# Patient Record
Sex: Female | Born: 1970
Health system: Southern US, Community
[De-identification: ages and names within clinical notes are randomized; demographics above are authoritative.]

## PROBLEM LIST (undated history)

## (undated) ENCOUNTER — Emergency Department (HOSPITAL_COMMUNITY): Admission: EM | Payer: Self-pay

## (undated) ENCOUNTER — Emergency Department (HOSPITAL_COMMUNITY): Payer: No Typology Code available for payment source | Source: Home / Self Care

## (undated) DIAGNOSIS — E785 Hyperlipidemia, unspecified: Secondary | ICD-10-CM

## (undated) DIAGNOSIS — E119 Type 2 diabetes mellitus without complications: Secondary | ICD-10-CM

## (undated) DIAGNOSIS — J45909 Unspecified asthma, uncomplicated: Secondary | ICD-10-CM

## (undated) DIAGNOSIS — E1165 Type 2 diabetes mellitus with hyperglycemia: Secondary | ICD-10-CM

## (undated) DIAGNOSIS — I10 Essential (primary) hypertension: Secondary | ICD-10-CM

## (undated) HISTORY — DX: Hyperlipidemia, unspecified: E78.5

## (undated) HISTORY — DX: Type 2 diabetes mellitus without complications: E11.9

## (undated) HISTORY — DX: Unspecified asthma, uncomplicated: J45.909

---
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
## 2001-10-08 DIAGNOSIS — IMO0002 Reserved for concepts with insufficient information to code with codable children: Secondary | ICD-10-CM

## 2001-10-08 DIAGNOSIS — I10 Essential (primary) hypertension: Secondary | ICD-10-CM

## 2001-10-08 HISTORY — DX: Essential (primary) hypertension: I10

## 2001-10-08 HISTORY — DX: Reserved for concepts with insufficient information to code with codable children: IMO0002

## 2004-10-08 HISTORY — PX: ABDOMINAL HYSTERECTOMY: SHX81

## 2013-10-09 ENCOUNTER — Encounter (HOSPITAL_COMMUNITY): Payer: Self-pay | Admitting: Emergency Medicine

## 2013-10-09 ENCOUNTER — Emergency Department (HOSPITAL_COMMUNITY)
Admission: EM | Admit: 2013-10-09 | Discharge: 2013-10-09 | Disposition: A | Discharge status: Home / Self Care | Payer: Self-pay | Attending: Emergency Medicine | Admitting: Emergency Medicine

## 2013-10-09 ENCOUNTER — Emergency Department (HOSPITAL_COMMUNITY): Payer: Self-pay

## 2013-10-09 DIAGNOSIS — R51 Headache: Secondary | ICD-10-CM

## 2013-10-09 DIAGNOSIS — Z79899 Other long term (current) drug therapy: Secondary | ICD-10-CM | POA: Insufficient documentation

## 2013-10-09 DIAGNOSIS — S4980XA Other specified injuries of shoulder and upper arm, unspecified arm, initial encounter: Secondary | ICD-10-CM | POA: Insufficient documentation

## 2013-10-09 DIAGNOSIS — R519 Headache, unspecified: Secondary | ICD-10-CM

## 2013-10-09 DIAGNOSIS — S0990XA Unspecified injury of head, initial encounter: Secondary | ICD-10-CM | POA: Insufficient documentation

## 2013-10-09 DIAGNOSIS — F17200 Nicotine dependence, unspecified, uncomplicated: Secondary | ICD-10-CM | POA: Insufficient documentation

## 2013-10-09 DIAGNOSIS — Y9389 Activity, other specified: Secondary | ICD-10-CM | POA: Insufficient documentation

## 2013-10-09 DIAGNOSIS — S46909A Unspecified injury of unspecified muscle, fascia and tendon at shoulder and upper arm level, unspecified arm, initial encounter: Secondary | ICD-10-CM | POA: Insufficient documentation

## 2013-10-09 DIAGNOSIS — I10 Essential (primary) hypertension: Secondary | ICD-10-CM | POA: Insufficient documentation

## 2013-10-09 DIAGNOSIS — Y92410 Unspecified street and highway as the place of occurrence of the external cause: Secondary | ICD-10-CM | POA: Insufficient documentation

## 2013-10-09 HISTORY — DX: Essential (primary) hypertension: I10

## 2013-10-09 MED ORDER — CYCLOBENZAPRINE HCL 10 MG PO TABS: 10.0000 mg | ORAL_TABLET | Freq: Two times a day (BID) | ORAL | Status: DC | PRN

## 2013-10-09 MED ORDER — METHOCARBAMOL 500 MG PO TABS
500.0000 mg | ORAL_TABLET | Freq: Once | ORAL | Status: AC
  Administered 2013-10-09: 500 mg via ORAL
  Filled 2013-10-09: qty 1

## 2013-10-09 MED ORDER — NAPROXEN 500 MG PO TABS: 500.0000 mg | ORAL_TABLET | Freq: Two times a day (BID) | ORAL | Status: DC

## 2013-10-09 MED ORDER — NAPROXEN 500 MG PO TABS
500.0000 mg | ORAL_TABLET | Freq: Once | ORAL | Status: AC
  Administered 2013-10-09: 500 mg via ORAL
  Filled 2013-10-09: qty 1

## 2013-10-09 MED ORDER — CYCLOBENZAPRINE HCL 10 MG PO TABS: 10.0000 mg | ORAL_TABLET | Freq: Once | ORAL | Status: DC

## 2013-10-09 NOTE — Discharge Instructions (Signed)
Take Naprosyn as directed for pain. Take Flexeril as needed for muscle pain/spasm. Refer to attached documents for more information. Return to the ED with worsening or concerning symptoms.
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
## 2013-10-21 NOTE — ED Provider Notes (Signed)
Medical screening examination/treatment/procedure(s) were performed by non-physician practitioner and as supervising physician I was immediately available for consultation/collaboration.  EKG Interpretation   None         Elizjah Noblet, MD 10/21/13 0003 

## 2014-10-18 ENCOUNTER — Encounter: Payer: Self-pay | Admitting: Family Medicine

## 2014-10-18 ENCOUNTER — Ambulatory Visit: Payer: Self-pay | Attending: Family Medicine | Admitting: Family Medicine

## 2014-10-18 VITALS — BP 170/100 | HR 107 | Temp 98.2°F | Resp 16 | Ht 67.0 in | Wt 200.0 lb

## 2014-10-18 DIAGNOSIS — E785 Hyperlipidemia, unspecified: Secondary | ICD-10-CM

## 2014-10-18 DIAGNOSIS — F1010 Alcohol abuse, uncomplicated: Secondary | ICD-10-CM

## 2014-10-18 DIAGNOSIS — M25475 Effusion, left foot: Secondary | ICD-10-CM | POA: Insufficient documentation

## 2014-10-18 DIAGNOSIS — I152 Hypertension secondary to endocrine disorders: Secondary | ICD-10-CM

## 2014-10-18 DIAGNOSIS — I10 Essential (primary) hypertension: Secondary | ICD-10-CM

## 2014-10-18 DIAGNOSIS — E1169 Type 2 diabetes mellitus with other specified complication: Secondary | ICD-10-CM

## 2014-10-18 DIAGNOSIS — E118 Type 2 diabetes mellitus with unspecified complications: Secondary | ICD-10-CM

## 2014-10-18 DIAGNOSIS — Z90710 Acquired absence of both cervix and uterus: Secondary | ICD-10-CM | POA: Insufficient documentation

## 2014-10-18 DIAGNOSIS — E119 Type 2 diabetes mellitus without complications: Secondary | ICD-10-CM | POA: Insufficient documentation

## 2014-10-18 DIAGNOSIS — M79672 Pain in left foot: Secondary | ICD-10-CM

## 2014-10-18 LAB — CBC
HCT: 38.9 % (ref 36.0–46.0)
HEMOGLOBIN: 12.5 g/dL (ref 12.0–15.0)
MCH: 28.4 pg (ref 26.0–34.0)
MCHC: 32.1 g/dL (ref 30.0–36.0)
MCV: 88.4 fL (ref 78.0–100.0)
MPV: 12 fL (ref 8.6–12.4)
Platelets: 231 10*3/uL (ref 150–400)
RBC: 4.4 MIL/uL (ref 3.87–5.11)
RDW: 13.2 % (ref 11.5–15.5)
WBC: 7.1 10*3/uL (ref 4.0–10.5)

## 2014-10-18 LAB — LIPID PANEL
CHOLESTEROL: 216 mg/dL — AB (ref 0–200)
HDL: 64 mg/dL (ref >39)
TRIGLYCERIDES: 783 mg/dL — AB (ref <150)
Total CHOL/HDL Ratio: 3.4 Ratio

## 2014-10-18 LAB — TSH: TSH: 1.276 u[IU]/mL (ref 0.350–4.500)

## 2014-10-18 LAB — COMPLETE METABOLIC PANEL WITH GFR
ALT: 21 U/L (ref 0–35)
AST: 22 U/L (ref 0–37)
Albumin: 3.7 g/dL (ref 3.5–5.2)
Alkaline Phosphatase: 42 U/L (ref 39–117)
BILIRUBIN TOTAL: 0.5 mg/dL (ref 0.2–1.2)
BUN: 14 mg/dL (ref 6–23)
CALCIUM: 9.3 mg/dL (ref 8.4–10.5)
CO2: 26 meq/L (ref 19–32)
CREATININE: 0.78 mg/dL (ref 0.50–1.10)
Chloride: 101 mEq/L (ref 96–112)
GFR, Est African American: >89 mL/min
GLUCOSE: 200 mg/dL — AB (ref 70–99)
Potassium: 4.5 mEq/L (ref 3.5–5.3)
Sodium: 136 mEq/L (ref 135–145)
Total Protein: 6.6 g/dL (ref 6.0–8.3)

## 2014-10-18 LAB — POCT GLYCOSYLATED HEMOGLOBIN (HGB A1C): HEMOGLOBIN A1C: 8.1

## 2014-10-18 LAB — URIC ACID: URIC ACID, SERUM: 5.5 mg/dL (ref 2.4–7.0)

## 2014-10-18 LAB — GLUCOSE, POCT (MANUAL RESULT ENTRY): POC Glucose: 177 mg/dl — AB (ref 70–99)

## 2014-10-18 MED ORDER — TRUERESULT BLOOD GLUCOSE W/DEVICE KIT: 1.0000 | PACK | Freq: Three times a day (TID) | Status: DC

## 2014-10-18 MED ORDER — AMLODIPINE BESYLATE 10 MG PO TABS: 5.0000 mg | ORAL_TABLET | Freq: Every day | ORAL | Status: DC

## 2014-10-18 MED ORDER — LISINOPRIL 40 MG PO TABS: 40.0000 mg | ORAL_TABLET | Freq: Every day | ORAL | Status: DC

## 2014-10-18 MED ORDER — GLIMEPIRIDE 4 MG PO TABS: 8.0000 mg | ORAL_TABLET | Freq: Every day | ORAL | Status: DC

## 2014-10-18 MED ORDER — CLONIDINE HCL 0.1 MG PO TABS
0.2000 mg | ORAL_TABLET | Freq: Once | ORAL | Status: AC
  Administered 2014-10-18: 0.2 mg via ORAL

## 2014-10-18 MED ORDER — METFORMIN HCL ER 500 MG PO TB24: 1000.0000 mg | ORAL_TABLET | Freq: Two times a day (BID) | ORAL | Status: DC

## 2014-10-18 MED ORDER — TRUEPLUS LANCETS 28G MISC: 1.0000 | Freq: Three times a day (TID) | Status: DC

## 2014-10-18 MED ORDER — GLUCOSE BLOOD VI STRP: 1.0000 | ORAL_STRIP | Freq: Three times a day (TID) | Status: DC

## 2014-10-18 NOTE — Progress Notes (Signed)
   Subjective:    Patient ID: Phyllis Kemp, female    DOB: 11/10/1970, 44 y.o.   MRN: [id number] CC: establish care, chronic HTN, DM2, HLD  HPI 44 yo F presents to establish care and discuss:  1. HTN: ran out of medicine x 2 days. Previously on lisinopril 20 mg daily. No cough. No CP or SOB. Sometimes headache.   2. DM2: does not have meter at home. Working on weight loss. Drinks water. No soda. Some juice. Out of metformin and amaryl for past 2-3 days. No polydipsia, polyuria,or tingling or numbness in extremities.   3. L foot pain: x 6 weeks. Started in 08/2014. Now resolved. Dorsal and plantar foot. Associated with swelling. No trauma. Patient self treated with ibuprofen 800 mg 2-3 times a day.   Soc Hx: non smoker  Med Hx: HTN dx 2003 Surg Hx: s/p hysterectomy in 2006 for uterine fibroids  Review of Systems As per HPI     Objective:   Physical Exam BP 183/122 mmHg  Pulse 107  Temp(Src) 98.2 F (36.8 C) (Oral)  Resp 16  Ht 5\' 7"  (1.702 m)  Wt 200 lb (90.719 kg)  BMI 31.32 kg/m2  SpO2 95% General appearance: alert, cooperative and no distress Throat: moist mucus membranes  Lungs: clear to auscultation bilaterally Heart: regular rate and rhythm, S1, S2 normal, no murmur, click, rub or gallop Extremities: extremities normal, atraumatic, no cyanosis or edema Skin: Skin color, texture, turgor normal. No rashes or lesions  Patient given clonidine 0.2 mg x one.    Diabetic foot exam done     Assessment & Plan:

## 2014-10-18 NOTE — Assessment & Plan Note (Signed)
A: DM2: fair control Lab Results  Component Value Date   HGBA1C 8.10 10/18/2014  P: Metformin XR Increase amaryl to 8 mg daily CBG monitoring at home

## 2014-10-18 NOTE — Assessment & Plan Note (Signed)
2. HTN: BP goal < 140/90  Lisinopril 40 mg daily Norvasc 5 mg daily with a plan to increase to 10 mg daily if BP not at goal on 5 mg

## 2014-10-18 NOTE — Progress Notes (Signed)
Pt is here to establish care. Pt has a history of HTN, diabetes, hyperlipidemia.

## 2014-10-18 NOTE — Assessment & Plan Note (Signed)
A: pain has resolved. History sounds like gout. P: Uric acid

## 2014-10-18 NOTE — Patient Instructions (Addendum)
Ms. Dannielle Burnownes,  Thank you for coming in today. It was a pleasure meeting you. I look forward to being your primary doctor.   1. For your diet:  1. Make sure to eat breakfast, lunch and dinner (also may add one snack mid morning or mid afternoon).  2. Carbs: no more than 2 servings (30 gram/2oz) per meal and 1 serving per snack.  3. Exercise such that you sweat some and your heart rate goes up most days of the week.  4. Water, water, water  5. Check blood sugar 2-3 x per day to get a feel for how different foods effect your blood sugar:  Goal fasting 70-130  Goal after eating < 160  6. Beware of hypoglycemia which is blood sugar < 70 with or without symptoms.   Will likely change statin so removed simvastatin for now, will review cholesterol results.   2. HTN: BP goal < 140/90  Lisinopril 40 mg daily Norvasc 5 mg daily with a plan to increase to 10 mg daily if BP not at goal on 5 mg   You will be called with lab results F/u in 3 weeks for RN BP check and blood sugar review F/u with me in 6 weeks for physical with pap smear.   Dr. Armen PickupFunches

## 2014-10-19 ENCOUNTER — Telehealth: Payer: Self-pay | Admitting: *Deleted

## 2014-10-19 DIAGNOSIS — E785 Hyperlipidemia, unspecified: Secondary | ICD-10-CM

## 2014-10-19 DIAGNOSIS — E1169 Type 2 diabetes mellitus with other specified complication: Secondary | ICD-10-CM | POA: Insufficient documentation

## 2014-10-19 LAB — MICROALBUMIN / CREATININE URINE RATIO
CREATININE, URINE: 240.5 mg/dL
Microalb Creat Ratio: 32.4 mg/g — ABNORMAL HIGH (ref 0.0–30.0)
Microalb, Ur: 7.8 mg/dL — ABNORMAL HIGH (ref <2.0)

## 2014-10-19 MED ORDER — ATORVASTATIN CALCIUM 40 MG PO TABS: 40.0000 mg | ORAL_TABLET | Freq: Every day | ORAL | Status: DC

## 2014-10-19 NOTE — Addendum Note (Signed)
Addended by: Dessa PhiFUNCHES, Genowefa Morga on: 10/19/2014 09:32 AM   Modules accepted: Orders

## 2014-10-19 NOTE — Telephone Encounter (Signed)
-----   Message from Lora PaulaJosalyn C Funches, MD sent at 10/19/2014  9:32 AM EST ----- Labs normal except for elevated blood sugar (200), elevated urine microalbumin, and elevated non-fasting total cholesterol and triglycerides. Continue current treatment plan with the addition of lipitor 40 mg daily.

## 2014-10-19 NOTE — Telephone Encounter (Signed)
Left VM to return my call

## 2014-10-22 ENCOUNTER — Telehealth: Payer: Self-pay | Admitting: Emergency Medicine

## 2014-10-22 NOTE — Telephone Encounter (Signed)
-----   Message from Josalyn C Funches, MD sent at 10/19/2014  9:32 AM EST ----- Labs normal except for elevated blood sugar (200), elevated urine microalbumin, and elevated non-fasting total cholesterol and triglycerides. Continue current treatment plan with the addition of lipitor 40 mg daily. 

## 2014-10-22 NOTE — Telephone Encounter (Signed)
Left message for pt to call for lab results Script Lipitor 40 mg tab @ [facility]

## 2014-10-27 ENCOUNTER — Telehealth: Payer: Self-pay | Admitting: *Deleted

## 2014-10-27 NOTE — Telephone Encounter (Signed)
Pt is aware of her lab results.  

## 2015-08-04 ENCOUNTER — Other Ambulatory Visit: Payer: Self-pay | Admitting: Family Medicine

## 2015-08-05 NOTE — Telephone Encounter (Signed)
Pt calling to check on the status of this refill; she states that she is completely out. Please contact the patient at the earliest convenience as she is hoping to have this expedited. Thank you, Phyllis BasemanSadie Kemp, ASA

## 2015-08-08 NOTE — Telephone Encounter (Signed)
Patient called and requested a med refill for her blood pressure medications. Please f/u with pt.

## 2015-08-19 ENCOUNTER — Other Ambulatory Visit: Payer: Self-pay | Admitting: *Deleted

## 2015-08-19 ENCOUNTER — Ambulatory Visit: Payer: Self-pay | Attending: Family Medicine | Admitting: Family Medicine

## 2015-08-19 ENCOUNTER — Encounter: Payer: Self-pay | Admitting: Family Medicine

## 2015-08-19 VITALS — BP 148/91 | HR 107 | Temp 99.2°F | Resp 16 | Ht 67.0 in | Wt 198.0 lb

## 2015-08-19 DIAGNOSIS — I10 Essential (primary) hypertension: Secondary | ICD-10-CM | POA: Insufficient documentation

## 2015-08-19 DIAGNOSIS — R21 Rash and other nonspecific skin eruption: Secondary | ICD-10-CM | POA: Insufficient documentation

## 2015-08-19 DIAGNOSIS — E118 Type 2 diabetes mellitus with unspecified complications: Secondary | ICD-10-CM | POA: Insufficient documentation

## 2015-08-19 LAB — GLUCOSE, POCT (MANUAL RESULT ENTRY): POC Glucose: 172 mg/dl — AB (ref 70–99)

## 2015-08-19 LAB — POCT GLYCOSYLATED HEMOGLOBIN (HGB A1C): Hemoglobin A1C: 8.3

## 2015-08-19 MED ORDER — METFORMIN HCL ER 500 MG PO TB24: 1000.0000 mg | ORAL_TABLET | Freq: Two times a day (BID) | ORAL | Status: DC

## 2015-08-19 MED ORDER — CLOBETASOL PROPIONATE 0.05 % EX CREA: 1.0000 "application " | TOPICAL_CREAM | Freq: Two times a day (BID) | CUTANEOUS | Status: DC

## 2015-08-19 MED ORDER — AMLODIPINE BESYLATE 10 MG PO TABS: 10.0000 mg | ORAL_TABLET | Freq: Every day | ORAL | Status: DC

## 2015-08-19 MED ORDER — LISINOPRIL 10 MG PO TABS: 10.0000 mg | ORAL_TABLET | Freq: Every day | ORAL | Status: DC

## 2015-08-19 NOTE — Progress Notes (Signed)
Subjective:  Patient ID: Phyllis Kemp, female    DOB: Aug 14, 1971  Age: 44 y.o. MRN: [id number]  CC: Diabetes and Hypertension   HPI Phyllis Kemp presents for   1 CHRONIC DIABETES  Disease Monitoring  Blood Sugar Ranges: not checking   Polyuria: no   Visual problems: no   Medication Compliance: yes  Medication Side Effects  Hypoglycemia: no   Preventitive Health Care  Eye Exam: due   Foot Exam: done today   Diet pattern: low salt and low carb  Exercise: minimal   2. CHRONIC HYPERTENSION  Disease Monitoring  Blood pressure range: not checking   Chest pain: no   Dyspnea: no   Claudication: no   Medication compliance: taking norvasc 10 mg daily   Medication Side Effects  Lightheadedness: no   Urinary frequency: no   Edema: no   Impotence: yes   Preventitive Healthcare:  Exercise: no   Diet Pattern: low salt and low carb   Salt Restriction: yes   3. Rash: hyperpigmented papules on arms and legs. Started a few months ago. Suspects bug bites. No new lesions. Distressed by hyperpigmentation.   Social History  Substance Use Topics  . Smoking status: Never Smoker   . Smokeless tobacco: Never Used  . Alcohol Use: No    Outpatient Prescriptions Prior to Visit  Medication Sig Dispense Refill  . amLODipine (NORVASC) 10 MG tablet Take 0.5 tablets (5 mg total) by mouth daily. 90 tablet 1  . atorvastatin (LIPITOR) 40 MG tablet Take 1 tablet (40 mg total) by mouth daily. 90 tablet 3  . Blood Glucose Monitoring Suppl (TRUERESULT BLOOD GLUCOSE) W/DEVICE KIT 1 each by Does not apply route 3 (three) times daily before meals. 1 each 0  . glimepiride (AMARYL) 4 MG tablet Take 2 tablets (8 mg total) by mouth daily with breakfast. 180 tablet 1  . glucose blood (TRUETEST TEST) test strip 1 each by Other route 3 (three) times daily. Use as instructed 100 each 12  . lisinopril (PRINIVIL,ZESTRIL) 40 MG tablet Take 1 tablet (40 mg total) by mouth daily. 90 tablet 1  . metFORMIN  (GLUCOPHAGE XR) 500 MG 24 hr tablet Take 2 tablets (1,000 mg total) by mouth 2 (two) times daily after a meal. 360 tablet 1  . TRUEPLUS LANCETS 28G MISC 1 each by Does not apply route 3 (three) times daily. 100 each 12   No facility-administered medications prior to visit.    ROS Review of Systems  Constitutional: Negative for fever and chills.  Eyes: Negative for visual disturbance.  Respiratory: Negative for shortness of breath.   Cardiovascular: Negative for chest pain.  Gastrointestinal: Negative for abdominal pain and blood in stool.  Musculoskeletal: Negative for back pain and arthralgias.  Skin: Positive for rash.  Allergic/Immunologic: Negative for immunocompromised state.  Hematological: Negative for adenopathy. Does not bruise/bleed easily.  Psychiatric/Behavioral: Negative for suicidal ideas and dysphoric mood.    Objective:  BP 148/91 mmHg  Pulse 107  Temp(Src) 99.2 F (37.3 C) (Oral)  Resp 16  Ht 5' 7"  (1.702 m)  Wt 198 lb (89.812 kg)  BMI 31.00 kg/m2  SpO2 99%  BP/Weight 08/19/2015 1/77/9390 3/0/0923  Systolic BP 300 762 263  Diastolic BP 91 335 90  Wt. (Lbs) 198 200 -  BMI 31 31.32 -    Physical Exam  Constitutional: She is oriented to person, place, and time. She appears well-developed and well-nourished. No distress.  HENT:  Head: Normocephalic and atraumatic.  Cardiovascular: Normal  rate, regular rhythm, normal heart sounds and intact distal pulses.   Pulmonary/Chest: Effort normal and breath sounds normal.  Musculoskeletal: She exhibits no edema.  Neurological: She is alert and oriented to person, place, and time.  Skin: Skin is warm and dry. Rash (hyperpigmented papules on arms and legs ) noted.  Psychiatric: She has a normal mood and affect.    Lab Results  Component Value Date   HGBA1C 8.10 10/18/2014   Lab Results  Component Value Date   HGBA1C 8.30 08/19/2015    CBG 172  Assessment & Plan:   Problem List Items Addressed This Visit     DM2 (diabetes mellitus, type 2) ([place]) - Primary (Chronic)   Relevant Medications   lisinopril (PRINIVIL,ZESTRIL) 10 MG tablet   metFORMIN (GLUCOPHAGE XR) 500 MG 24 hr tablet   Other Relevant Orders   POCT glycosylated hemoglobin (Hb A1C) (Completed)   POCT glucose (manual entry) (Completed)   HTN (hypertension) (Chronic)   Relevant Medications   amLODipine (NORVASC) 10 MG tablet   lisinopril (PRINIVIL,ZESTRIL) 10 MG tablet    Other Visit Diagnoses    Rash        Relevant Medications    clobetasol cream (TEMOVATE) 0.05 %       No orders of the defined types were placed in this encounter.    Follow-up: No Follow-up on file.   Boykin Nearing MD

## 2015-08-19 NOTE — Patient Instructions (Addendum)
Phyllis Kemp was seen today for diabetes and hypertension.  Diagnoses and all orders for this visit:  Type 2 diabetes mellitus with complication, unspecified long term insulin use status (HCC) -     POCT glycosylated hemoglobin (Hb A1C) -     POCT glucose (manual entry) -     metFORMIN (GLUCOPHAGE XR) 500 MG 24 hr tablet; Take 2 tablets (1,000 mg total) by mouth 2 (two) times daily after a meal.  Essential hypertension -     amLODipine (NORVASC) 10 MG tablet; Take 1 tablet (10 mg total) by mouth daily. -     lisinopril (PRINIVIL,ZESTRIL) 10 MG tablet; Take 1 tablet (10 mg total) by mouth daily.  Rash -     clobetasol cream (TEMOVATE) 0.05 %; Apply 1 application topically 2 (two) times daily.   F/u in 4 weeks for pap smear  Dr. Armen PickupFunches

## 2015-08-19 NOTE — Telephone Encounter (Signed)
Patient has an appointment today. Refills should be addressed today.

## 2015-08-19 NOTE — Progress Notes (Signed)
F/U DM HTN No pain today  No suicidal thought in the past two week  No hx tobacco user

## 2015-09-30 ENCOUNTER — Other Ambulatory Visit: Payer: Self-pay | Admitting: Family Medicine

## 2015-10-05 ENCOUNTER — Emergency Department (HOSPITAL_COMMUNITY): Payer: Self-pay

## 2015-10-05 ENCOUNTER — Emergency Department (HOSPITAL_COMMUNITY)
Admission: EM | Admit: 2015-10-05 | Discharge: 2015-10-05 | Disposition: A | Discharge status: Home / Self Care | Payer: Self-pay | Attending: Emergency Medicine | Admitting: Emergency Medicine

## 2015-10-05 ENCOUNTER — Encounter (HOSPITAL_COMMUNITY): Payer: Self-pay | Admitting: Emergency Medicine

## 2015-10-05 DIAGNOSIS — I10 Essential (primary) hypertension: Secondary | ICD-10-CM

## 2015-10-05 DIAGNOSIS — I159 Secondary hypertension, unspecified: Secondary | ICD-10-CM | POA: Insufficient documentation

## 2015-10-05 DIAGNOSIS — J069 Acute upper respiratory infection, unspecified: Secondary | ICD-10-CM | POA: Insufficient documentation

## 2015-10-05 DIAGNOSIS — Z79899 Other long term (current) drug therapy: Secondary | ICD-10-CM | POA: Insufficient documentation

## 2015-10-05 DIAGNOSIS — E119 Type 2 diabetes mellitus without complications: Secondary | ICD-10-CM | POA: Insufficient documentation

## 2015-10-05 MED ORDER — LISINOPRIL 10 MG PO TABS: 10.0000 mg | ORAL_TABLET | Freq: Every day | ORAL | Status: DC

## 2015-10-05 MED ORDER — DM-GUAIFENESIN ER 30-600 MG PO TB12: 1.0000 | ORAL_TABLET | Freq: Two times a day (BID) | ORAL | Status: DC

## 2015-10-05 MED ORDER — AMLODIPINE BESYLATE 10 MG PO TABS: 10.0000 mg | ORAL_TABLET | Freq: Every day | ORAL | Status: DC

## 2015-10-05 NOTE — ED Notes (Signed)
Patient transported to X-ray 

## 2015-10-05 NOTE — ED Provider Notes (Signed)
CSN: [id number]     Arrival date & time 10/05/15  7741 History   First MD Initiated Contact with Patient 10/05/15 0830     Chief Complaint  Patient presents with  . URI     (Consider location/radiation/quality/duration/timing/severity/associated sxs/prior Treatment) HPI   Patient to the ER with complaints of cough for the past 4 days. She reports taking a whole bottle of Nyquil OTC without any relief. She has thick mucous that is yellow. She has scratchy throat as well. No fevers, wheezing, ear pain or nasal congestion. She says that her cough started a few days after running out of her BP medications, Lisinopril and Amlodipine. She is requesting refill for these, she is hypertensive at 175/112 in the ED today. Checked her sugar last 3 days ago and says it was at her baseline.  PCP: Minerva Ends, MD  PMH: hypertension and diabetes.  ROS: The patient denies diaphoresis, fever, headache, weakness (general or focal), confusion, change of vision,  dysphagia, aphagia, shortness of breath,  abdominal pains, nausea, vomiting, diarrhea, lower extremity swelling, rash, neck pain, chest pain   Past Medical History  Diagnosis Date  . Hypertension 2003  . Diabetes mellitus without complication ([place]) 2878   Past Surgical History  Procedure Laterality Date  . Appendectomy  1985  . Abdominal hysterectomy  2006    partial for fibroid tumors, still has cervix and ovaries    Family History  Problem Relation Age of Onset  . Diabetes Mother   . Hypertension Mother   . Cancer Maternal Aunt     breast   . Depression Maternal Grandmother    Social History  Substance Use Topics  . Smoking status: Never Smoker   . Smokeless tobacco: Never Used  . Alcohol Use: 0.0 oz/week    0 Standard drinks or equivalent per week     Comment: socially   OB History    No data available     Review of Systems  Review of Systems All other systems negative except as documented in the HPI. All pertinent  positives and negatives as reviewed in the HPI.   Allergies  Review of patient's allergies indicates no known allergies.  Home Medications   Prior to Admission medications   Medication Sig Start Date End Date Taking? Authorizing Provider  Blood Glucose Monitoring Suppl (TRUERESULT BLOOD GLUCOSE) W/DEVICE KIT 1 each by Does not apply route 3 (three) times daily before meals. 10/18/14  Yes Josalyn Funches, MD  glucose blood (TRUETEST TEST) test strip 1 each by Other route 3 (three) times daily. Use as instructed 10/18/14  Yes Josalyn Funches, MD  metFORMIN (GLUCOPHAGE XR) 500 MG 24 hr tablet Take 2 tablets (1,000 mg total) by mouth 2 (two) times daily after a meal. 08/19/15  Yes Josalyn Funches, MD  TRUEPLUS LANCETS 28G MISC 1 each by Does not apply route 3 (three) times daily. 10/18/14  Yes Josalyn Funches, MD  amLODipine (NORVASC) 10 MG tablet Take 1 tablet (10 mg total) by mouth daily. 10/05/15   Tabathia Knoche Carlota Raspberry, PA-C  clobetasol cream (TEMOVATE) 6.76 % Apply 1 application topically 2 (two) times daily. 08/19/15   Josalyn Funches, MD  lisinopril (PRINIVIL,ZESTRIL) 10 MG tablet Take 1 tablet (10 mg total) by mouth daily. 10/05/15   Betha Shadix Carlota Raspberry, PA-C   BP 175/112 mmHg  Pulse 96  Temp(Src) 98 F (36.7 C) (Oral)  Resp 18  SpO2 98% Physical Exam  Constitutional: She appears well-developed and well-nourished. No distress.  HENT:  Head:  Normocephalic and atraumatic.  Right Ear: Tympanic membrane and ear canal normal.  Left Ear: Tympanic membrane and ear canal normal.  Nose: Nose normal.  Mouth/Throat: Uvula is midline, oropharynx is clear and moist and mucous membranes are normal.  Eyes: Pupils are equal, round, and reactive to light.  Neck: Normal range of motion. Neck supple.  Cardiovascular: Normal rate and regular rhythm.   Pulmonary/Chest: Effort normal and breath sounds normal. She has no decreased breath sounds. She has no wheezes. She has no rhonchi. She has no rales.  Abdominal:  Soft.  No signs of abdominal distention  Musculoskeletal:  No LE swelling  Neurological: She is alert.  Acting at baseline  Skin: Skin is warm and dry. No rash noted.  Nursing note and vitals reviewed.   ED Course  Procedures (including critical care time) Labs Review Labs Reviewed - No data to display  Imaging Review Dg Chest 2 View  10/05/2015  CLINICAL DATA:  Cough, shortness of breath, chest tightness EXAM: CHEST  2 VIEW COMPARISON:  None. FINDINGS: Lungs are clear.  No pleural effusion or pneumothorax. The heart is top-normal in size. Mild degenerative changes of the visualized thoracolumbar spine. IMPRESSION: No evidence of acute cardiopulmonary disease. Electronically Signed   By: Julian Hy M.D.   On: 10/05/2015 09:47   I have personally reviewed and evaluated these images and lab results as part of my medical decision-making.   EKG Interpretation None      MDM   Final diagnoses:  URI (upper respiratory infection)  Secondary hypertension, unspecified   Refilled amlodipine and lisinopril.  Rx: Mucinex DM.  Given work note.  Pt symptoms consistent with URI. CXR negative for acute infiltrate or CHF. Pt will be discharged with symptomatic treatment.  Discussed return precautions.  Pt is hemodynamically stable & in NAD prior to discharge.     Delos Haring, PA-C 10/05/15 1003  Virgel Manifold, MD 10/06/15 [phone]

## 2015-10-05 NOTE — ED Notes (Signed)
See PA assessment 

## 2015-10-05 NOTE — Discharge Instructions (Signed)
Cool Mist Vaporizers Vaporizers may help relieve the symptoms of a cough and cold. They add moisture to the air, which helps mucus to become thinner and less sticky. This makes it easier to breathe and cough up secretions. Cool mist vaporizers do not cause serious burns like hot mist vaporizers, which may also be called steamers or humidifiers. Vaporizers have not been proven to help with colds. You should not use a vaporizer if you are allergic to mold. HOME CARE INSTRUCTIONS  Follow the package instructions for the vaporizer.  Do not use anything other than distilled water in the vaporizer.  Do not run the vaporizer all of the time. This can cause mold or bacteria to grow in the vaporizer.  Clean the vaporizer after each time it is used.  Clean and dry the vaporizer well before storing it.  Stop using the vaporizer if worsening respiratory symptoms develop.   This information is not intended to replace advice given to you by your health care provider. Make sure you discuss any questions you have with your health care provider.   Document Released: 06/21/2004 Document Revised: 09/29/2013 Document Reviewed: 02/11/2013 Elsevier Interactive Patient Education 2016 [employer]. Hypertension Hypertension, commonly called high blood pressure, is when the force of blood pumping through your arteries is too strong. Your arteries are the blood vessels that carry blood from your heart throughout your body. A blood pressure reading consists of a higher number over a lower number, such as 110/72. The higher number (systolic) is the pressure inside your arteries when your heart pumps. The lower number (diastolic) is the pressure inside your arteries when your heart relaxes. Ideally you want your blood pressure below 120/80. Hypertension forces your heart to work harder to pump blood. Your arteries may become narrow or stiff. Having untreated or uncontrolled hypertension can cause heart attack, stroke,  kidney disease, and other problems. RISK FACTORS Some risk factors for high blood pressure are controllable. Others are not.  Risk factors you cannot control include:   Race. You may be at higher risk if you are African American.  Age. Risk increases with age.  Gender. Men are at higher risk than women before age 59 years. After age 33, women are at higher risk than men. Risk factors you can control include:  Not getting enough exercise or physical activity.  Being overweight.  Getting too much fat, sugar, calories, or salt in your diet.  Drinking too much alcohol. SIGNS AND SYMPTOMS Hypertension does not usually cause signs or symptoms. Extremely high blood pressure (hypertensive crisis) may cause headache, anxiety, shortness of breath, and nosebleed. DIAGNOSIS To check if you have hypertension, your health care provider will measure your blood pressure while you are seated, with your arm held at the level of your heart. It should be measured at least twice using the same arm. Certain conditions can cause a difference in blood pressure between your right and left arms. A blood pressure reading that is higher than normal on one occasion does not mean that you need treatment. If it is not clear whether you have high blood pressure, you may be asked to return on a different day to have your blood pressure checked again. Or, you may be asked to monitor your blood pressure at home for 1 or more weeks. TREATMENT Treating high blood pressure includes making lifestyle changes and possibly taking medicine. Living a healthy lifestyle can help lower high blood pressure. You may need to change some of your habits. Lifestyle  changes may include:  Following the DASH diet. This diet is high in fruits, vegetables, and whole grains. It is low in salt, red meat, and added sugars.  Keep your sodium intake below 2,300 mg per day.  Getting at least 30-45 minutes of aerobic exercise at least 4 times per  week.  Losing weight if necessary.  Not smoking.  Limiting alcoholic beverages.  Learning ways to reduce stress. Your health care provider may prescribe medicine if lifestyle changes are not enough to get your blood pressure under control, and if one of the following is true:  You are 100-38 years of age and your systolic blood pressure is above 140.  You are 70 years of age or older, and your systolic blood pressure is above 150.  Your diastolic blood pressure is above 90.  You have diabetes, and your systolic blood pressure is over 140 or your diastolic blood pressure is over 90.  You have kidney disease and your blood pressure is above 140/90.  You have heart disease and your blood pressure is above 140/90. Your personal target blood pressure may vary depending on your medical conditions, your age, and other factors. HOME CARE INSTRUCTIONS  Have your blood pressure rechecked as directed by your health care provider.   Take medicines only as directed by your health care provider. Follow the directions carefully. Blood pressure medicines must be taken as prescribed. The medicine does not work as well when you skip doses. Skipping doses also puts you at risk for problems.  Do not smoke.   Monitor your blood pressure at home as directed by your health care provider. SEEK MEDICAL CARE IF:   You think you are having a reaction to medicines taken.  You have recurrent headaches or feel dizzy.  You have swelling in your ankles.  You have trouble with your vision. SEEK IMMEDIATE MEDICAL CARE IF:  You develop a severe headache or confusion.  You have unusual weakness, numbness, or feel faint.  You have severe chest or abdominal pain.  You vomit repeatedly.  You have trouble breathing. MAKE SURE YOU:   Understand these instructions.  Will watch your condition.  Will get help right away if you are not doing well or get worse.   This information is not intended to  replace advice given to you by your health care provider. Make sure you discuss any questions you have with your health care provider.   Document Released: 09/24/2005 Document Revised: 02/08/2015 Document Reviewed: 07/17/2013 Elsevier Interactive Patient Education 2016 Elsevier Inc. Upper Respiratory Infection, Adult Most upper respiratory infections (URIs) are a viral infection of the air passages leading to the lungs. A URI affects the nose, throat, and upper air passages. The most common type of URI is nasopharyngitis and is typically referred to as "the common cold." URIs run their course and usually go away on their own. Most of the time, a URI does not require medical attention, but sometimes a bacterial infection in the upper airways can follow a viral infection. This is called a secondary infection. Sinus and middle ear infections are common types of secondary upper respiratory infections. Bacterial pneumonia can also complicate a URI. A URI can worsen asthma and chronic obstructive pulmonary disease (COPD). Sometimes, these complications can require emergency medical care and may be life threatening.  CAUSES Almost all URIs are caused by viruses. A virus is a type of germ and can spread from one person to another.  RISKS FACTORS You may be at  risk for a URI if:   You smoke.   You have chronic heart or lung disease.  You have a weakened defense (immune) system.   You are very young or very old.   You have nasal allergies or asthma.  You work in crowded or poorly ventilated areas.  You work in health care facilities or schools. SIGNS AND SYMPTOMS  Symptoms typically develop 2-3 days after you come in contact with a cold virus. Most viral URIs last 7-10 days. However, viral URIs from the influenza virus (flu virus) can last 14-18 days and are typically more severe. Symptoms may include:   Runny or stuffy (congested) nose.   Sneezing.   Cough.   Sore throat.    Headache.   Fatigue.   Fever.   Loss of appetite.   Pain in your forehead, behind your eyes, and over your cheekbones (sinus pain).  Muscle aches.  DIAGNOSIS  Your health care provider may diagnose a URI by:  Physical exam.  Tests to check that your symptoms are not due to another condition such as:  Strep throat.  Sinusitis.  Pneumonia.  Asthma. TREATMENT  A URI goes away on its own with time. It cannot be cured with medicines, but medicines may be prescribed or recommended to relieve symptoms. Medicines may help:  Reduce your fever.  Reduce your cough.  Relieve nasal congestion. HOME CARE INSTRUCTIONS   Take medicines only as directed by your health care provider.   Gargle warm saltwater or take cough drops to comfort your throat as directed by your health care provider.  Use a warm mist humidifier or inhale steam from a shower to increase air moisture. This may make it easier to breathe.  Drink enough fluid to keep your urine clear or pale yellow.   Eat soups and other clear broths and maintain good nutrition.   Rest as needed.   Return to work when your temperature has returned to normal or as your health care provider advises. You may need to stay home longer to avoid infecting others. You can also use a face mask and careful hand washing to prevent spread of the virus.  Increase the usage of your inhaler if you have asthma.   Do not use any tobacco products, including cigarettes, chewing tobacco, or electronic cigarettes. If you need help quitting, ask your health care provider. PREVENTION  The best way to protect yourself from getting a cold is to practice good hygiene.   Avoid oral or hand contact with people with cold symptoms.   Wash your hands often if contact occurs.  There is no clear evidence that vitamin C, vitamin E, echinacea, or exercise reduces the chance of developing a cold. However, it is always recommended to get plenty  of rest, exercise, and practice good nutrition.  SEEK MEDICAL CARE IF:   You are getting worse rather than better.   Your symptoms are not controlled by medicine.   You have chills.  You have worsening shortness of breath.  You have brown or red mucus.  You have yellow or brown nasal discharge.  You have pain in your face, especially when you bend forward.  You have a fever.  You have swollen neck glands.  You have pain while swallowing.  You have white areas in the back of your throat. SEEK IMMEDIATE MEDICAL CARE IF:   You have severe or persistent:  Headache.  Ear pain.  Sinus pain.  Chest pain.  You have chronic lung  disease and any of the following:  Wheezing.  Prolonged cough.  Coughing up blood.  A change in your usual mucus.  You have a stiff neck.  You have changes in your:  Vision.  Hearing.  Thinking.  Mood. MAKE SURE YOU:   Understand these instructions.  Will watch your condition.  Will get help right away if you are not doing well or get worse.   This information is not intended to replace advice given to you by your health care provider. Make sure you discuss any questions you have with your health care provider.   Document Released: 03/20/2001 Document Revised: 02/08/2015 Document Reviewed: 12/30/2013 Elsevier Interactive Patient Education [employer]

## 2015-10-05 NOTE — ED Notes (Signed)
Patient states URI x 4 days.  Patient states has been taking Nyquil OTC for several days.   Patient states blowing yellow thick mucous, and is coughing up the same.    Denies other symptoms.

## 2015-11-16 ENCOUNTER — Telehealth: Payer: Self-pay | Admitting: Family Medicine

## 2015-11-16 DIAGNOSIS — I10 Essential (primary) hypertension: Secondary | ICD-10-CM

## 2015-11-16 NOTE — Telephone Encounter (Signed)
Pt needs refill on BP meds (lisinopril, amlodpine)...Marland KitchenMarland Kitchenplease send to [facility] pharmacy

## 2015-11-17 MED ORDER — AMLODIPINE BESYLATE 10 MG PO TABS: 10.0000 mg | ORAL_TABLET | Freq: Every day | ORAL | Status: DC

## 2015-11-17 MED ORDER — LISINOPRIL 10 MG PO TABS: 10.0000 mg | ORAL_TABLET | Freq: Every day | ORAL | Status: DC

## 2015-11-17 MED FILL — AMLODIPINE BESYLATE 10 MG T: 10 | 30 days supply | Qty: 30 | Fill #0

## 2015-11-17 MED FILL — LISINOPRIL 10 MG TABLET: 10 | 30 days supply | Qty: 30 | Fill #0

## 2015-11-17 NOTE — Telephone Encounter (Signed)
[facility]VM Rx refills send to [facility] pharmacy  Pt need OV for future refills

## 2015-12-24 ENCOUNTER — Emergency Department (HOSPITAL_COMMUNITY)
Admission: EM | Admit: 2015-12-24 | Discharge: 2015-12-24 | Disposition: A | Discharge status: Home / Self Care | Payer: No Typology Code available for payment source | Attending: Emergency Medicine | Admitting: Emergency Medicine

## 2015-12-24 ENCOUNTER — Encounter (HOSPITAL_COMMUNITY): Payer: Self-pay | Admitting: Physical Medicine and Rehabilitation

## 2015-12-24 DIAGNOSIS — E119 Type 2 diabetes mellitus without complications: Secondary | ICD-10-CM | POA: Insufficient documentation

## 2015-12-24 DIAGNOSIS — R51 Headache: Secondary | ICD-10-CM | POA: Insufficient documentation

## 2015-12-24 DIAGNOSIS — R112 Nausea with vomiting, unspecified: Secondary | ICD-10-CM | POA: Insufficient documentation

## 2015-12-24 DIAGNOSIS — R519 Headache, unspecified: Secondary | ICD-10-CM

## 2015-12-24 DIAGNOSIS — I10 Essential (primary) hypertension: Secondary | ICD-10-CM

## 2015-12-24 DIAGNOSIS — Z7984 Long term (current) use of oral hypoglycemic drugs: Secondary | ICD-10-CM | POA: Insufficient documentation

## 2015-12-24 LAB — BASIC METABOLIC PANEL
ANION GAP: 13 (ref 5–15)
BUN: 7 mg/dL (ref 6–20)
CHLORIDE: 98 mmol/L — AB (ref 101–111)
CO2: 26 mmol/L (ref 22–32)
Calcium: 8.9 mg/dL (ref 8.9–10.3)
Creatinine, Ser: 0.61 mg/dL (ref 0.44–1.00)
GFR calc Af Amer: >60 mL/min (ref >60)
GFR calc non Af Amer: >60 mL/min (ref >60)
GLUCOSE: 250 mg/dL — AB (ref 65–99)
POTASSIUM: 4.2 mmol/L (ref 3.5–5.1)
Sodium: 137 mmol/L (ref 135–145)

## 2015-12-24 MED ORDER — ONDANSETRON HCL 4 MG PO TABS
4.0000 mg | ORAL_TABLET | Freq: Once | ORAL | Status: AC
  Administered 2015-12-24: 4 mg via ORAL
  Filled 2015-12-24: qty 1

## 2015-12-24 MED ORDER — AMLODIPINE BESYLATE 10 MG PO TABS: 10.0000 mg | ORAL_TABLET | Freq: Every day | ORAL | Status: DC

## 2015-12-24 MED ORDER — METFORMIN HCL 500 MG PO TABS
500.0000 mg | ORAL_TABLET | Freq: Once | ORAL | Status: AC
  Administered 2015-12-24: 500 mg via ORAL
  Filled 2015-12-24: qty 1

## 2015-12-24 MED ORDER — LISINOPRIL 10 MG PO TABS
10.0000 mg | ORAL_TABLET | Freq: Once | ORAL | Status: AC
  Administered 2015-12-24: 10 mg via ORAL
  Filled 2015-12-24: qty 1

## 2015-12-24 MED ORDER — LISINOPRIL 10 MG PO TABS: 10.0000 mg | ORAL_TABLET | Freq: Every day | ORAL | Status: DC

## 2015-12-24 MED ORDER — ACETAMINOPHEN 325 MG PO TABS
650.0000 mg | ORAL_TABLET | Freq: Once | ORAL | Status: AC
  Administered 2015-12-24: 650 mg via ORAL
  Filled 2015-12-24: qty 2

## 2015-12-24 MED ORDER — AMLODIPINE BESYLATE 5 MG PO TABS
10.0000 mg | ORAL_TABLET | Freq: Once | ORAL | Status: AC
  Administered 2015-12-24: 10 mg via ORAL
  Filled 2015-12-24: qty 2

## 2015-12-24 NOTE — ED Provider Notes (Signed)
CSN: [id number]     Arrival date & time 12/24/15  0914 History   First MD Initiated Contact with Patient 12/24/15 1016     Chief Complaint  Patient presents with  . Headache  . Hypertension     (Consider location/radiation/quality/duration/timing/severity/associated sxs/prior Treatment) HPI   Phyllis Kemp is a 45 y.o. female with PMH significant for DM and HTN who presents with 1 day history of gradual onset, intermittent, moderate, throbbing frontal headache.  No meds PTA.  She had associated N/V.  She reports she ran out of her amlodipine and lisinopril 2 days ago.  Denies visual disturbance, diplopia, AMS, confusion, severe headache, neck stiffness, slurred speech, unilateral weakness, facial droop, CP, SOB, abdominal pain, or urinary symptoms.  No head trauma or injury.   Past Medical History  Diagnosis Date  . Hypertension 2003  . Diabetes mellitus without complication ([place]) 1829   Past Surgical History  Procedure Laterality Date  . Appendectomy  1985  . Abdominal hysterectomy  2006    partial for fibroid tumors, still has cervix and ovaries    Family History  Problem Relation Age of Onset  . Diabetes Mother   . Hypertension Mother   . Cancer Maternal Aunt     breast   . Depression Maternal Grandmother    Social History  Substance Use Topics  . Smoking status: Never Smoker   . Smokeless tobacco: Never Used  . Alcohol Use: 0.0 oz/week    0 Standard drinks or equivalent per week     Comment: socially   OB History    No data available     Review of Systems All other systems negative unless otherwise stated in HPI    Allergies  Review of patient's allergies indicates no known allergies.  Home Medications   Prior to Admission medications   Medication Sig Start Date End Date Taking? Authorizing Provider  amLODipine (NORVASC) 10 MG tablet Take 1 tablet (10 mg total) by mouth daily. Pt needs OV for future refills 11/17/15  Yes Josalyn Funches, MD  lisinopril  (PRINIVIL,ZESTRIL) 10 MG tablet Take 1 tablet (10 mg total) by mouth daily. Pt needs OV for future refills 11/17/15  Yes Boykin Nearing, MD  metFORMIN (GLUCOPHAGE XR) 500 MG 24 hr tablet Take 2 tablets (1,000 mg total) by mouth 2 (two) times daily after a meal. 08/19/15  Yes Josalyn Funches, MD  Blood Glucose Monitoring Suppl (TRUERESULT BLOOD GLUCOSE) W/DEVICE KIT 1 each by Does not apply route 3 (three) times daily before meals. Patient not taking: Reported on 12/24/2015 10/18/14   Boykin Nearing, MD  clobetasol cream (TEMOVATE) 9.37 % Apply 1 application topically 2 (two) times daily. Patient not taking: Reported on 12/24/2015 08/19/15   Boykin Nearing, MD  dextromethorphan-guaiFENesin (MUCINEX DM) 30-600 MG 12hr tablet Take 1 tablet by mouth 2 (two) times daily. Patient not taking: Reported on 12/24/2015 10/05/15   Delos Haring, PA-C  glucose blood (TRUETEST TEST) test strip 1 each by Other route 3 (three) times daily. Use as instructed Patient not taking: Reported on 12/24/2015 10/18/14   Boykin Nearing, MD  TRUEPLUS LANCETS 28G MISC 1 each by Does not apply route 3 (three) times daily. Patient not taking: Reported on 12/24/2015 10/18/14   Josalyn Funches, MD   BP 153/86 mmHg  Pulse 101  Temp(Src) 98.3 F (36.8 C) (Oral)  Resp 16  Ht _0  (1.702 m)  Wt 90.719 kg  BMI 31.32 kg/m2  SpO2 100% Physical Exam  Constitutional: She is oriented  to person, place, and time. She appears well-developed and well-nourished.  Non-toxic appearance. She does not have a sickly appearance. She does not appear ill.  HENT:  Head: Normocephalic and atraumatic.  Mouth/Throat: Oropharynx is clear and moist.  Eyes: Conjunctivae are normal. Pupils are equal, round, and reactive to light.  Neck: Normal range of motion. Neck supple.  FAROM of cervical spine.   Cardiovascular: Normal rate, regular rhythm and normal heart sounds.   No murmur heard. Pulmonary/Chest: Effort normal and breath sounds normal. No  accessory muscle usage or stridor. No respiratory distress. She has no wheezes. She has no rhonchi. She has no rales.  Abdominal: Soft. Bowel sounds are normal. She exhibits no distension. There is no tenderness.  Musculoskeletal: Normal range of motion.  Lymphadenopathy:    She has no cervical adenopathy.  Neurological: She is alert and oriented to person, place, and time.  Mental Status:   AOx3.  Speech clear without dysarthria. Cranial Nerves:  I-not tested  II-PERRLA  III, IV, VI-EOMs intact  V-temporal and masseter strength intact  VII-symmetrical facial movements intact, no facial droop  VIII-hearing grossly intact bilaterally  IX, X-gag intact  XI-strength of sternomastoid and trapezius muscles 5/5  XII-tongue midline Motor:   Good muscle bulk and tone  Strength 5/5 bilaterally in upper and lower extremities   Cerebellar--intact RAMs, finger to nose intact bilaterally.  Gait normal  No pronator drift Sensory:  Intact in upper and lower extremities   Skin: Skin is warm and dry.  Psychiatric: She has a normal mood and affect. Her behavior is normal.    ED Course  Procedures (including critical care time) Labs Review Labs Reviewed  BASIC METABOLIC PANEL - Abnormal; Notable for the following:    Chloride 98 (*)    Glucose, Bld 250 (*)    All other components within normal limits    Imaging Review No results found. I have personally reviewed and evaluated these images and lab results as part of my medical decision-making.   EKG Interpretation None      MDM   Final diagnoses:  Nonintractable headache, unspecified chronicity pattern, unspecified headache type  Essential hypertension  Non-intractable vomiting with nausea, vomiting of unspecified type   Patient presents with headache and elevated BP.  Patient has been out of her BP medications x 2 days.  On arrival, BP 173/103.  Normal neuro exam.  Headache intermittent, gradual onset, no neck stiffness, AMS.   She did have intermittent episodes of vomiting that have resolved.  BMP without acute abnormalities, glucose 250, she has DM.  Low suspicion for [facility].  Patient given PO amlodipine and lisinopril.  BP improved with last reading 153/86.  Patient able to tolerate PO intake without difficulty.  Plan to discharge home with home BP medications.  Discussed return precautions.  Patient agrees anda acknowledges the above plan for discharge.   Case has been discussed with Dr. Regenia Skeeter who agrees with the above plan for discharge.        Gloriann Loan, PA-C 12/24/15 [place], MD 12/25/15 [phone]

## 2015-12-24 NOTE — ED Notes (Signed)
PA at the bedside.

## 2015-12-24 NOTE — Discharge Instructions (Signed)
Hypertension Hypertension, commonly called high blood pressure, is when the force of blood pumping through your arteries is too strong. Your arteries are the blood vessels that carry blood from your heart throughout your body. A blood pressure reading consists of a higher number over a lower number, such as 110/72. The higher number (systolic) is the pressure inside your arteries when your heart pumps. The lower number (diastolic) is the pressure inside your arteries when your heart relaxes. Ideally you want your blood pressure below 120/80. Hypertension forces your heart to work harder to pump blood. Your arteries may become narrow or stiff. Having untreated or uncontrolled hypertension can cause heart attack, stroke, kidney disease, and other problems. RISK FACTORS Some risk factors for high blood pressure are controllable. Others are not.  Risk factors you cannot control include:   Race. You may be at higher risk if you are African American.  Age. Risk increases with age.  Gender. Men are at higher risk than women before age 45 years. After age 65, women are at higher risk than men. Risk factors you can control include:  Not getting enough exercise or physical activity.  Being overweight.  Getting too much fat, sugar, calories, or salt in your diet.  Drinking too much alcohol. SIGNS AND SYMPTOMS Hypertension does not usually cause signs or symptoms. Extremely high blood pressure (hypertensive crisis) may cause headache, anxiety, shortness of breath, and nosebleed. DIAGNOSIS To check if you have hypertension, your health care provider will measure your blood pressure while you are seated, with your arm held at the level of your heart. It should be measured at least twice using the same arm. Certain conditions can cause a difference in blood pressure between your right and left arms. A blood pressure reading that is higher than normal on one occasion does not mean that you need treatment. If  it is not clear whether you have high blood pressure, you may be asked to return on a different day to have your blood pressure checked again. Or, you may be asked to monitor your blood pressure at home for 1 or more weeks. TREATMENT Treating high blood pressure includes making lifestyle changes and possibly taking medicine. Living a healthy lifestyle can help lower high blood pressure. You may need to change some of your habits. Lifestyle changes may include:  Following the DASH diet. This diet is high in fruits, vegetables, and whole grains. It is low in salt, red meat, and added sugars.  Keep your sodium intake below 2,300 mg per day.  Getting at least 30-45 minutes of aerobic exercise at least 4 times per week.  Losing weight if necessary.  Not smoking.  Limiting alcoholic beverages.  Learning ways to reduce stress. Your health care provider may prescribe medicine if lifestyle changes are not enough to get your blood pressure under control, and if one of the following is true:  You are 18-59 years of age and your systolic blood pressure is above 140.  You are 60 years of age or older, and your systolic blood pressure is above 150.  Your diastolic blood pressure is above 90.  You have diabetes, and your systolic blood pressure is over 140 or your diastolic blood pressure is over 90.  You have kidney disease and your blood pressure is above 140/90.  You have heart disease and your blood pressure is above 140/90. Your personal target blood pressure may vary depending on your medical conditions, your age, and other factors. HOME CARE INSTRUCTIONS    Have your blood pressure rechecked as directed by your health care provider.   Take medicines only as directed by your health care provider. Follow the directions carefully. Blood pressure medicines must be taken as prescribed. The medicine does not work as well when you skip doses. Skipping doses also puts you at risk for  problems.  Do not smoke.   Monitor your blood pressure at home as directed by your health care provider. SEEK MEDICAL CARE IF:   You think you are having a reaction to medicines taken.  You have recurrent headaches or feel dizzy.  You have swelling in your ankles.  You have trouble with your vision. SEEK IMMEDIATE MEDICAL CARE IF:  You develop a severe headache or confusion.  You have unusual weakness, numbness, or feel faint.  You have severe chest or abdominal pain.  You vomit repeatedly.  You have trouble breathing. MAKE SURE YOU:   Understand these instructions.  Will watch your condition.  Will get help right away if you are not doing well or get worse.   This information is not intended to replace advice given to you by your health care provider. Make sure you discuss any questions you have with your health care provider.   Document Released: 09/24/2005 Document Revised: 02/08/2015 Document Reviewed: 07/17/2013 Elsevier Interactive Patient Education 2016 Elsevier Inc.  

## 2015-12-24 NOTE — ED Notes (Signed)
Pt reports headache and vomiting. States she ran out of BP medications x2 days ago

## 2015-12-29 MED FILL — AMLODIPINE BESYLATE 10 MG T: 10 | 30 days supply | Qty: 30 | Fill #0

## 2015-12-29 MED FILL — LISINOPRIL 10 MG TABLET: 10 | 30 days supply | Qty: 30 | Fill #0

## 2016-03-12 ENCOUNTER — Emergency Department (HOSPITAL_COMMUNITY)
Admission: EM | Admit: 2016-03-12 | Discharge: 2016-03-12 | Disposition: A | Discharge status: Home / Self Care | Payer: No Typology Code available for payment source | Attending: Emergency Medicine | Admitting: Emergency Medicine

## 2016-03-12 ENCOUNTER — Encounter (HOSPITAL_COMMUNITY): Payer: Self-pay | Admitting: Emergency Medicine

## 2016-03-12 DIAGNOSIS — Y9389 Activity, other specified: Secondary | ICD-10-CM | POA: Insufficient documentation

## 2016-03-12 DIAGNOSIS — E119 Type 2 diabetes mellitus without complications: Secondary | ICD-10-CM | POA: Insufficient documentation

## 2016-03-12 DIAGNOSIS — Z760 Encounter for issue of repeat prescription: Secondary | ICD-10-CM

## 2016-03-12 DIAGNOSIS — R519 Headache, unspecified: Secondary | ICD-10-CM

## 2016-03-12 DIAGNOSIS — Z7984 Long term (current) use of oral hypoglycemic drugs: Secondary | ICD-10-CM | POA: Insufficient documentation

## 2016-03-12 DIAGNOSIS — I10 Essential (primary) hypertension: Secondary | ICD-10-CM | POA: Insufficient documentation

## 2016-03-12 DIAGNOSIS — Y9289 Other specified places as the place of occurrence of the external cause: Secondary | ICD-10-CM | POA: Insufficient documentation

## 2016-03-12 DIAGNOSIS — T63441A Toxic effect of venom of bees, accidental (unintentional), initial encounter: Secondary | ICD-10-CM | POA: Insufficient documentation

## 2016-03-12 DIAGNOSIS — Z79899 Other long term (current) drug therapy: Secondary | ICD-10-CM | POA: Insufficient documentation

## 2016-03-12 DIAGNOSIS — Y998 Other external cause status: Secondary | ICD-10-CM | POA: Insufficient documentation

## 2016-03-12 DIAGNOSIS — R51 Headache: Secondary | ICD-10-CM | POA: Insufficient documentation

## 2016-03-12 DIAGNOSIS — T63444A Toxic effect of venom of bees, undetermined, initial encounter: Secondary | ICD-10-CM

## 2016-03-12 DIAGNOSIS — E118 Type 2 diabetes mellitus with unspecified complications: Secondary | ICD-10-CM

## 2016-03-12 DIAGNOSIS — Z23 Encounter for immunization: Secondary | ICD-10-CM | POA: Insufficient documentation

## 2016-03-12 LAB — CBG MONITORING, ED: GLUCOSE-CAPILLARY: 166 mg/dL — AB (ref 65–99)

## 2016-03-12 MED ORDER — AMLODIPINE BESYLATE 10 MG PO TABS: 10.0000 mg | ORAL_TABLET | Freq: Every day | ORAL | Status: DC

## 2016-03-12 MED ORDER — METFORMIN HCL ER 500 MG PO TB24
1000.0000 mg | ORAL_TABLET | Freq: Two times a day (BID) | ORAL | Status: DC
Start: 2016-03-12 — End: 2016-03-12

## 2016-03-12 MED ORDER — ACETAMINOPHEN 325 MG PO TABS
975.0000 mg | ORAL_TABLET | Freq: Once | ORAL | Status: AC
  Administered 2016-03-12: 975 mg via ORAL
  Filled 2016-03-12: qty 3

## 2016-03-12 MED ORDER — TETANUS-DIPHTH-ACELL PERTUSSIS 5-2.5-18.5 LF-MCG/0.5 IM SUSP
0.5000 mL | Freq: Once | INTRAMUSCULAR | Status: AC
  Administered 2016-03-12: 0.5 mL via INTRAMUSCULAR
  Filled 2016-03-12: qty 0.5

## 2016-03-12 MED ORDER — METFORMIN HCL ER 500 MG PO TB24
1000.0000 mg | ORAL_TABLET | Freq: Once | ORAL | Status: AC
Start: 2016-03-12 — End: 2016-03-12
  Administered 2016-03-12: 1000 mg via ORAL
  Filled 2016-03-12: qty 2

## 2016-03-12 MED ORDER — PROCHLORPERAZINE MALEATE 5 MG PO TABS
5.0000 mg | ORAL_TABLET | Freq: Once | ORAL | Status: AC
  Administered 2016-03-12: 5 mg via ORAL
  Filled 2016-03-12: qty 1

## 2016-03-12 MED ORDER — LISINOPRIL 10 MG PO TABS: 10.0000 mg | ORAL_TABLET | Freq: Every day | ORAL | Status: DC

## 2016-03-12 MED ORDER — METFORMIN HCL ER 500 MG PO TB24: 1000.0000 mg | ORAL_TABLET | Freq: Two times a day (BID) | ORAL | Status: DC

## 2016-03-12 MED ORDER — LISINOPRIL 10 MG PO TABS
10.0000 mg | ORAL_TABLET | Freq: Once | ORAL | Status: AC
  Administered 2016-03-12: 10 mg via ORAL
  Filled 2016-03-12: qty 1

## 2016-03-12 MED ORDER — AMLODIPINE BESYLATE 5 MG PO TABS
10.0000 mg | ORAL_TABLET | Freq: Once | ORAL | Status: AC
  Administered 2016-03-12: 10 mg via ORAL
  Filled 2016-03-12: qty 2

## 2016-03-12 MED ORDER — AMLODIPINE BESYLATE 5 MG PO TABS: 10.0000 mg | ORAL_TABLET | Freq: Every day | ORAL | Status: DC

## 2016-03-12 MED FILL — METFORMIN HCL ER 500 MG TAB: 500 | 30 days supply | Qty: 120 | Fill #0

## 2016-03-12 MED FILL — ?AMLODIPINE BESYLATE 10 MG: 10 | 30 days supply | Qty: 30 | Fill #0

## 2016-03-12 MED FILL — ?LISINOPRIL 10 MG TABLET: 10 | 30 days supply | Qty: 30 | Fill #0

## 2016-03-12 NOTE — ED Notes (Signed)
Pt c/o headache at back of head. Pt states she has been out of medication for 7 days.

## 2016-03-12 NOTE — ED Notes (Signed)
Patient states has chronic headaches.  [facility], but claims she is out of headache medication.   Patient denies other symptoms.

## 2016-03-12 NOTE — Discharge Instructions (Signed)
Please follow with your primary care doctor in the next 2 days for a check-up. They must obtain records for further management.  ° °Do not hesitate to return to the Emergency Department for any new, worsening or concerning symptoms.  ° °

## 2016-03-12 NOTE — ED Provider Notes (Signed)
CSN: [id number]     Arrival date & time 03/12/16  5732 History   First MD Initiated Contact with Patient 03/12/16 1005     Chief Complaint  Patient presents with  . Headache     (Consider location/radiation/quality/duration/timing/severity/associated sxs/prior Treatment) HPI   Blood pressure 168/107, pulse 103, temperature 98.7 F (37.1 C), temperature source Oral, resp. rate 18, height 5' 7" (1.702 m), weight 90.719 kg, SpO2 100 %.  Cathryn Gallery is a 45 y.o. female complaining of Occipital headache when she woke this morning, this is consistent with prior headaches, she attributes it to her blood pressure being high, she ran out of her hypertension and diabetes medication 7 days ago. [facility], has an appointment set up for next week. She rates her pain at 5 out of 10, no pain medication taken prior to arrival, no exacerbating or alleviating symptoms identified. Pt denies fever, rash, confusion, cervicalgia, LOC/syncope, change in vision, N/V, numbness, weakness, dysarthria, ataxia, thunderclap onset, exacerbation with exertion or valsalva, exacerbation in morning, CP, SOB, abdominal pain. She also notes that she was stung by a bee yesterday on the left upper back, states her tetanus is not up-to-date. Patient confirms that she takes lisinopril 10 mg and amlodipine 10 mg in addition to metformin.   Past Medical History  Diagnosis Date  . Hypertension 2003  . Diabetes mellitus without complication ([place]) 2025   Past Surgical History  Procedure Laterality Date  . Appendectomy  1985  . Abdominal hysterectomy  2006    partial for fibroid tumors, still has cervix and ovaries    Family History  Problem Relation Age of Onset  . Diabetes Mother   . Hypertension Mother   . Cancer Maternal Aunt     breast   . Depression Maternal Grandmother    Social History  Substance Use Topics  . Smoking status: Never Smoker   . Smokeless tobacco: Never Used  . Alcohol  Use: 0.0 oz/week    0 Standard drinks or equivalent per week     Comment: socially   OB History    No data available     Review of Systems  10 systems reviewed and found to be negative, except as noted in the HPI.  Allergies  Review of patient's allergies indicates no known allergies.  Home Medications   Prior to Admission medications   Medication Sig Start Date End Date Taking? Authorizing Provider  amLODipine (NORVASC) 10 MG tablet Take 1 tablet (10 mg total) by mouth daily. Pt needs OV for future refills 11/17/15   Boykin Nearing, MD  amLODipine (NORVASC) 10 MG tablet Take 1 tablet (10 mg total) by mouth daily. 12/24/15   Gloriann Loan, PA-C  Blood Glucose Monitoring Suppl (TRUERESULT BLOOD GLUCOSE) W/DEVICE KIT 1 each by Does not apply route 3 (three) times daily before meals. Patient not taking: Reported on 12/24/2015 10/18/14   Boykin Nearing, MD  clobetasol cream (TEMOVATE) 4.27 % Apply 1 application topically 2 (two) times daily. Patient not taking: Reported on 12/24/2015 08/19/15   Boykin Nearing, MD  dextromethorphan-guaiFENesin (MUCINEX DM) 30-600 MG 12hr tablet Take 1 tablet by mouth 2 (two) times daily. Patient not taking: Reported on 12/24/2015 10/05/15   Delos Haring, PA-C  glucose blood (TRUETEST TEST) test strip 1 each by Other route 3 (three) times daily. Use as instructed Patient not taking: Reported on 12/24/2015 10/18/14   Boykin Nearing, MD  lisinopril (PRINIVIL,ZESTRIL) 10 MG tablet Take 1 tablet (10 mg total)  by mouth daily. Pt needs OV for future refills 11/17/15   Boykin Nearing, MD  lisinopril (PRINIVIL,ZESTRIL) 10 MG tablet Take 1 tablet (10 mg total) by mouth daily. 12/24/15   Gloriann Loan, PA-C  metFORMIN (GLUCOPHAGE XR) 500 MG 24 hr tablet Take 2 tablets (1,000 mg total) by mouth 2 (two) times daily after a meal. 08/19/15   Josalyn Funches, MD  TRUEPLUS LANCETS 28G MISC 1 each by Does not apply route 3 (three) times daily. Patient not taking: Reported on  12/24/2015 10/18/14   Josalyn Funches, MD   BP 168/107 mmHg  Pulse 103  Temp(Src) 98.7 F (37.1 C) (Oral)  Resp 18  Ht 5' 7" (1.702 m)  Wt 90.719 kg  BMI 31.32 kg/m2  SpO2 100% Physical Exam  Constitutional: She is oriented to person, place, and time. She appears well-developed and well-nourished. No distress.  HENT:  Head: Normocephalic and atraumatic.  Mouth/Throat: Oropharynx is clear and moist.  Eyes: Conjunctivae and EOM are normal. Pupils are equal, round, and reactive to light.  No TTP of maxillary or frontal sinuses  No TTP or induration of temporal arteries bilaterally  Neck: Normal range of motion. Neck supple.  FROM to C-spine. Pt can touch chin to chest without discomfort. No TTP of midline cervical spine.   Cardiovascular: Normal rate, regular rhythm and intact distal pulses.   Pulmonary/Chest: Effort normal and breath sounds normal. No stridor. No respiratory distress. She has no wheezes. She has no rales. She exhibits no tenderness.  Abdominal: Soft. Bowel sounds are normal. There is no tenderness.  Musculoskeletal: Normal range of motion. She exhibits no edema or tenderness.  Neurological: She is alert and oriented to person, place, and time. No cranial nerve deficit.  II-Visual fields grossly intact. III/IV/VI-Extraocular movements intact.  Pupils reactive bilaterally. V/VII-Smile symmetric, equal eyebrow raise,  facial sensation intact VIII- Hearing grossly intact IX/X-Normal gag XI-bilateral shoulder shrug XII-midline tongue extension Motor: 5/5 bilaterally with normal tone and bulk Cerebellar: Normal finger-to-nose  and normal heel-to-shin test.   Romberg negative Ambulates with a coordinated gait   Skin:  Puncture wound to left thoracic back with no warmth, induration or discharge.  Psychiatric: She has a normal mood and affect.  Nursing note and vitals reviewed.   ED Course  Procedures (including critical care time) Labs Review Labs Reviewed - No  data to display  Imaging Review No results found. I have personally reviewed and evaluated these images and lab results as part of my medical decision-making.   EKG Interpretation None      MDM   Final diagnoses:  Acute nonintractable headache, unspecified headache type  Bee sting, undetermined intent, initial encounter  Medication refill    Filed Vitals:   03/12/16 1045 03/12/16 1100 03/12/16 1104 03/12/16 1105  BP: 165/97 156/97 156/97 156/97  Pulse: 94 95    Temp:      TempSrc:      Resp:      Height:      Weight:      SpO2: 98% 98%      Medications  amLODipine (NORVASC) tablet 10 mg (not administered)  lisinopril (PRINIVIL,ZESTRIL) tablet 10 mg (not administered)  metFORMIN (GLUCOPHAGE-XR) 24 hr tablet 1,000 mg (not administered)  acetaminophen (TYLENOL) tablet 975 mg (not administered)  prochlorperazine (COMPAZINE) tablet 5 mg (not administered)    Arna Luis is 45 y.o. female presenting with Headache exacerbation also requesting med refill. Neuro exam is nonfocal, she did out of her blood pressure and diabetes  medications for 1 week. Patient's tetanus shot will be updated, she has a bee sting to posterior back with no signs of infection. Hasn't put her primary care next week. CBG is elevated at 166, counseled patient on aggressive hydration. Tolerated by mouth challenge in the ED. Patient is given refill on amlodipine, lisinopril and metformin. States headache is resolved after Compazine and acetaminophen.  Evaluation does not show pathology that would require ongoing emergent intervention or inpatient treatment. Pt is hemodynamically stable and mentating appropriately. Discussed findings and plan with patient/guardian, who agrees with care plan. All questions answered. Return precautions discussed and outpatient follow up given.      Monico Blitz, PA-C 03/12/16 [place], MD 03/12/16 1143

## 2016-03-12 NOTE — ED Notes (Signed)
CBG 166 

## 2016-03-19 ENCOUNTER — Ambulatory Visit: Payer: Self-pay | Admitting: Family Medicine

## 2016-04-27 ENCOUNTER — Emergency Department (HOSPITAL_COMMUNITY)
Admission: EM | Admit: 2016-04-27 | Discharge: 2016-04-27 | Disposition: A | Discharge status: Home / Self Care | Payer: No Typology Code available for payment source | Attending: Emergency Medicine | Admitting: Emergency Medicine

## 2016-04-27 ENCOUNTER — Encounter (HOSPITAL_COMMUNITY): Payer: Self-pay | Admitting: *Deleted

## 2016-04-27 DIAGNOSIS — Z79899 Other long term (current) drug therapy: Secondary | ICD-10-CM | POA: Insufficient documentation

## 2016-04-27 DIAGNOSIS — M79674 Pain in right toe(s): Secondary | ICD-10-CM

## 2016-04-27 DIAGNOSIS — Z7984 Long term (current) use of oral hypoglycemic drugs: Secondary | ICD-10-CM | POA: Insufficient documentation

## 2016-04-27 DIAGNOSIS — I10 Essential (primary) hypertension: Secondary | ICD-10-CM | POA: Insufficient documentation

## 2016-04-27 DIAGNOSIS — E119 Type 2 diabetes mellitus without complications: Secondary | ICD-10-CM | POA: Insufficient documentation

## 2016-04-27 NOTE — Discharge Instructions (Signed)
Follow-up with your primary care provider on Monday or a podiatrist to have your toe reevaluated. Return to the emergency Department experience swelling, redness, increased pain, foul discharge, fever, chills, nausea or vomiting, numbness of your toe.

## 2016-04-27 NOTE — ED Provider Notes (Signed)
CSN: [id number]     Arrival date & time 04/27/16  1015 History   First MD Initiated Contact with Patient 04/27/16 1105     Chief Complaint  Patient presents with  . Toe Pain     (Consider location/radiation/quality/duration/timing/severity/associated sxs/prior Treatment) HPI   Patient is a 45 year old female with a history of diabetes and HTN who presents the ED with right great toe pain. Patient is being treated for toe fungus and her PCP stated her toenail may follow off. She was seen yesterday by her PCP but she wants a second opinion. She is complaining of discharge from her toenail and pain with touch. No other associated symptoms. Patient complaints. She denies fever, chills, abdominal pain, nausea, vomiting.  Past Medical History  Diagnosis Date  . Hypertension 2003  . Diabetes mellitus without complication (HCC) 2003   Past Surgical History  Procedure Laterality Date  . Appendectomy  1985  . Abdominal hysterectomy  2006    partial for fibroid tumors, still has cervix and ovaries    Family History  Problem Relation Age of Onset  . Diabetes Mother   . Hypertension Mother   . Cancer Maternal Aunt     breast   . Depression Maternal Grandmother    Social History  Substance Use Topics  . Smoking status: Never Smoker   . Smokeless tobacco: Never Used  . Alcohol Use: 0.0 oz/week    0 Standard drinks or equivalent per week     Comment: socially   OB History    No data available     Review of Systems  Constitutional: Negative for fever and chills.  Gastrointestinal: Negative for nausea, vomiting, abdominal pain and diarrhea.  Skin: Positive for wound (right great toenail).  Neurological: Negative for dizziness and headaches.      Allergies  Review of patient's allergies indicates no known allergies.  Home Medications   Prior to Admission medications   Medication Sig Start Date End Date Taking? Authorizing Provider  amLODipine (NORVASC) 10 MG tablet Take 1  tablet (10 mg total) by mouth daily. Pt needs OV for future refills 03/12/16  Yes Nicole Pisciotta, PA-C  ibuprofen (ADVIL,MOTRIN) 200 MG tablet Take 400 mg by mouth every 6 (six) hours as needed for headache (pain).    Yes Historical Provider, MD  lisinopril (PRINIVIL,ZESTRIL) 10 MG tablet Take 1 tablet (10 mg total) by mouth daily. 03/12/16  Yes Nicole Pisciotta, PA-C  metFORMIN (GLUCOPHAGE XR) 500 MG 24 hr tablet Take 2 tablets (1,000 mg total) by mouth 2 (two) times daily after a meal. 03/12/16  Yes Nicole Pisciotta, PA-C   BP 135/88 mmHg  Pulse 96  Temp(Src) 98 F (36.7 C) (Oral)  Resp 16  SpO2 96% Physical Exam  Constitutional: She appears well-developed and well-nourished. No distress.  HENT:  Head: Normocephalic and atraumatic.  Eyes: Conjunctivae are normal.  Pulmonary/Chest: Effort normal. No respiratory distress.  Musculoskeletal: Normal range of motion. She exhibits no edema.  Examination right great toe revealed nail is only attached at the cuticle, granulation tissue noted to right side of toenail, no discharge noted, no increased erythema, no edema, no signs of infection, nail bed appears healthy  Neurological: She is alert. Coordination normal.  Skin: Skin is warm and dry. She is not diaphoretic.  Psychiatric: She has a normal mood and affect. Her behavior is normal.  Nursing note and vitals reviewed.   ED Course  Procedures (including critical care time) Labs Review Labs Reviewed - No data to  display  Imaging Review No results found. I have personally reviewed and evaluated these images and lab results as part of my medical decision-making.   EKG Interpretation None      MDM   Final diagnoses:  Great toe pain, right   Patient with great toe pain. She was seen yesterday by her primary care provider who is going to refer the patient to a podiatrist. Patient was asking for second opinion of her toe. No signs of infection, good granulation tissue noted, no edema,  erythema or discharge noted. Instructed patient to follow up with her primary care provider in to see a podiatrist. Patient was stable at time of discharge. Discussed strict return precautions. Patient expressed understanding to the discharge instructions.  Case discussed and pt seen by Dr. Criss AlvineGoldston who agrees with the above plan.     Jerre SimonJessica L Focht, PA 04/27/16 1444   Pricilla LovelessScott Goldston, MD 05/03/16 [phone]

## 2016-04-27 NOTE — ED Notes (Signed)
NAD at this time. Pt is stable and going home.  

## 2016-04-27 NOTE — ED Notes (Signed)
Pt reports infected right big toenail. Hx of diabetes and reports having swelling and drainage to toe.

## 2016-11-29 ENCOUNTER — Emergency Department (HOSPITAL_COMMUNITY)
Admission: EM | Admit: 2016-11-29 | Discharge: 2016-11-29 | Disposition: A | Discharge status: Home / Self Care | Payer: Self-pay | Attending: Emergency Medicine | Admitting: Emergency Medicine

## 2016-11-29 ENCOUNTER — Encounter (HOSPITAL_COMMUNITY): Payer: Self-pay

## 2016-11-29 DIAGNOSIS — E118 Type 2 diabetes mellitus with unspecified complications: Secondary | ICD-10-CM

## 2016-11-29 DIAGNOSIS — E1165 Type 2 diabetes mellitus with hyperglycemia: Secondary | ICD-10-CM | POA: Insufficient documentation

## 2016-11-29 DIAGNOSIS — Z7984 Long term (current) use of oral hypoglycemic drugs: Secondary | ICD-10-CM | POA: Insufficient documentation

## 2016-11-29 DIAGNOSIS — I10 Essential (primary) hypertension: Secondary | ICD-10-CM | POA: Insufficient documentation

## 2016-11-29 DIAGNOSIS — Z79899 Other long term (current) drug therapy: Secondary | ICD-10-CM | POA: Insufficient documentation

## 2016-11-29 DIAGNOSIS — J069 Acute upper respiratory infection, unspecified: Secondary | ICD-10-CM | POA: Insufficient documentation

## 2016-11-29 LAB — CBC WITH DIFFERENTIAL/PLATELET
Basophils Absolute: 0 10*3/uL (ref 0.0–0.1)
Basophils Relative: 0 %
EOS ABS: 0.2 10*3/uL (ref 0.0–0.7)
Eosinophils Relative: 3 %
HCT: 39.5 % (ref 36.0–46.0)
Hemoglobin: 12.6 g/dL (ref 12.0–15.0)
LYMPHS ABS: 1.3 10*3/uL (ref 0.7–4.0)
LYMPHS PCT: 22 %
MCH: 27.7 pg (ref 26.0–34.0)
MCHC: 31.9 g/dL (ref 30.0–36.0)
MCV: 86.8 fL (ref 78.0–100.0)
Monocytes Absolute: 0.2 10*3/uL (ref 0.1–1.0)
Monocytes Relative: 3 %
NEUTROS ABS: 4.3 10*3/uL (ref 1.7–7.7)
NEUTROS PCT: 72 %
Platelets: 146 10*3/uL — ABNORMAL LOW (ref 150–400)
RBC: 4.55 MIL/uL (ref 3.87–5.11)
RDW: 12.8 % (ref 11.5–15.5)
WBC: 6 10*3/uL (ref 4.0–10.5)

## 2016-11-29 LAB — BASIC METABOLIC PANEL
Anion gap: 12 (ref 5–15)
BUN: 6 mg/dL (ref 6–20)
CHLORIDE: 99 mmol/L — AB (ref 101–111)
CO2: 23 mmol/L (ref 22–32)
Calcium: 9 mg/dL (ref 8.9–10.3)
Creatinine, Ser: 0.7 mg/dL (ref 0.44–1.00)
GFR calc Af Amer: >60 mL/min (ref >60)
GFR calc non Af Amer: >60 mL/min (ref >60)
Glucose, Bld: 342 mg/dL — ABNORMAL HIGH (ref 65–99)
POTASSIUM: 4.1 mmol/L (ref 3.5–5.1)
SODIUM: 134 mmol/L — AB (ref 135–145)

## 2016-11-29 MED ORDER — LISINOPRIL 10 MG PO TABS: 10.0000 mg | ORAL_TABLET | Freq: Every day | ORAL | 0 refills | Status: DC

## 2016-11-29 MED ORDER — SALINE SPRAY 0.65 % NA SOLN
1.0000 | Freq: Once | NASAL | Status: AC
  Administered 2016-11-29: 1 via NASAL
  Filled 2016-11-29 (×2): qty 44

## 2016-11-29 MED ORDER — LISINOPRIL 10 MG PO TABS
10.0000 mg | ORAL_TABLET | Freq: Every day | ORAL | Status: DC
  Administered 2016-11-29: 10 mg via ORAL
  Filled 2016-11-29: qty 1

## 2016-11-29 MED ORDER — AMLODIPINE BESYLATE 5 MG PO TABS
10.0000 mg | ORAL_TABLET | Freq: Every day | ORAL | Status: DC
  Administered 2016-11-29: 10 mg via ORAL
  Filled 2016-11-29: qty 2

## 2016-11-29 MED ORDER — METFORMIN HCL ER (MOD) 1000 MG PO TB24: 1000.0000 mg | ORAL_TABLET | Freq: Every day | ORAL | 0 refills | Status: DC

## 2016-11-29 MED ORDER — METFORMIN HCL ER 500 MG PO TB24: 1000.0000 mg | ORAL_TABLET | Freq: Two times a day (BID) | ORAL | Status: DC

## 2016-11-29 MED ORDER — AMLODIPINE BESYLATE 10 MG PO TABS: 10.0000 mg | ORAL_TABLET | Freq: Every day | ORAL | 0 refills | Status: DC

## 2016-11-29 MED FILL — METFORMIN HCL ER 500 MG TAB: 500 | 30 days supply | Qty: 60 | Fill #0

## 2016-11-29 MED FILL — ?LISINOPRIL 10 MG TABLET: 10 | 30 days supply | Qty: 30 | Fill #0

## 2016-11-29 MED FILL — AMLODIPINE BESYLATE 10 MG T: 10 | 30 days supply | Qty: 30 | Fill #0

## 2016-11-29 NOTE — Discharge Planning (Signed)
Pt up for discharge. EDCM reviewed chart for possible CM needs.  No needs identified or communicated.  

## 2016-11-29 NOTE — ED Provider Notes (Signed)
[facility]-EMERGENCY DEPT Provider Note   CSN: [id number] Arrival date & time: 11/29/16  1058  By signing my name below, I, Phyllis Kemp, attest that this documentation has been prepared under the direction and in the presence of Phyllis Lefevre, MD . Electronically Signed: Freida Kemp, Scribe. 11/29/2016. 12:22 PM.  History   Chief Complaint Chief Complaint  Patient presents with  . Fatigue  . Medication Refill    The history is provided by the patient. No language interpreter was used.     HPI Comments:  Phyllis Kemp is a 46 y.o. female with a history of HTN and DM who presents to the Emergency Department complaining of general malaise x 1 week. She reports associated sneezing.   Pt denies cough and congestion. No alleviating factors noted. Pt also notes she has been out of her DM and HTN meds x ~  3 weeks. She takes Lisinopril and Amlodipine for HTN and Metformin for her DM and is requesting a refill of these medications at this time.   Past Medical History:  Diagnosis Date  . Diabetes mellitus without complication (HCC) 2003  . Hypertension 2003    Patient Active Problem List   Diagnosis Date Noted  . Hyperlipidemia associated with type 2 diabetes mellitus (HCC) 10/19/2014  . DM2 (diabetes mellitus, type 2) (HCC) 10/18/2014  . HTN (hypertension) 10/18/2014  . Left foot pain 10/18/2014  . ETOH abuse 10/18/2014    Past Surgical History:  Procedure Laterality Date  . ABDOMINAL HYSTERECTOMY  2006   partial for fibroid tumors, still has cervix and ovaries   . APPENDECTOMY  1985    OB History    No data available       Home Medications    Prior to Admission medications   Medication Sig Start Date End Date Taking? Authorizing Provider  amLODipine (NORVASC) 10 MG tablet Take 1 tablet (10 mg total) by mouth daily. Pt needs OV for future refills 11/29/16   Phyllis Lefevre, MD  ibuprofen (ADVIL,MOTRIN) 200 MG tablet Take 400 mg by mouth every 6 (six) hours as needed for  headache (pain).     Historical Provider, MD  lisinopril (PRINIVIL,ZESTRIL) 10 MG tablet Take 1 tablet (10 mg total) by mouth daily. 11/29/16   Phyllis Lefevre, MD  metFORMIN (GLUMETZA) 1000 MG (MOD) 24 hr tablet Take 1 tablet (1,000 mg total) by mouth daily. 11/29/16   Phyllis Lefevre, MD    Family History Family History  Problem Relation Age of Onset  . Diabetes Mother   . Hypertension Mother   . Cancer Maternal Aunt     breast   . Depression Maternal Grandmother     Social History Social History  Substance Use Topics  . Smoking status: Never Smoker  . Smokeless tobacco: Never Used  . Alcohol use 0.0 oz/week     Comment: socially     Allergies   Patient has no known allergies.   Review of Systems Review of Systems  Constitutional: Negative for chills and fever.       + Malaise +Medication refill  HENT: Positive for sneezing. Negative for congestion.   Respiratory: Negative for cough.      Physical Exam Updated Vital Signs BP 176/86   Pulse 104   Temp 98.5 F (36.9 C) (Oral)   Resp 16   SpO2 100%   Physical Exam  Constitutional: She is oriented to person, place, and time. She appears well-developed and well-nourished. No distress.  HENT:  Head: Normocephalic and atraumatic.  Eyes: Conjunctivae are normal.  Cardiovascular: Normal rate.   Pulmonary/Chest: Effort normal.  Abdominal: She exhibits no distension.  Neurological: She is alert and oriented to person, place, and time.  Skin: Skin is warm and dry.  Psychiatric: She has a normal mood and affect.  Nursing note and vitals reviewed.    ED Treatments / Results  DIAGNOSTIC STUDIES:  Oxygen Saturation is 100% on RA, normal by my interpretation.    COORDINATION OF CARE:  12:22 PM Discussed treatment plan with pt at bedside and pt agreed to plan.  Labs (all labs ordered are listed, but only abnormal results are displayed) Labs Reviewed  CBC WITH DIFFERENTIAL/PLATELET - Abnormal; Notable for the  following:       Result Value   Platelets 146 (*)    All other components within normal limits  BASIC METABOLIC PANEL - Abnormal; Notable for the following:    Sodium 134 (*)    Chloride 99 (*)    Glucose, Bld 342 (*)    All other components within normal limits    EKG  EKG Interpretation None       Radiology No results found.  Procedures Procedures (including critical care time)  Medications Ordered in ED Medications  sodium chloride (OCEAN) 0.65 % nasal spray 1 spray (not administered)  lisinopril (PRINIVIL,ZESTRIL) tablet 10 mg (not administered)  metFORMIN (GLUCOPHAGE-XR) 24 hr tablet 1,000 mg (not administered)  amLODipine (NORVASC) tablet 10 mg (not administered)     Initial Impression / Assessment and Plan / ED Course  I have reviewed the triage vital signs and the nursing notes.  Pertinent labs & imaging results that were available during my care of the patient were reviewed by me and considered in my medical decision making (see chart for details).    Pt is out of all of her meds.  She is given rx for 1 month supply, but she is encouraged to f/u with her pcp.  She is given saline nasal spray for her uri sx.  She knows to return if worse.  Final Clinical Impressions(s) / ED Diagnoses   Final diagnoses:  Essential hypertension  Poorly controlled type 2 diabetes mellitus (HCC)  Viral upper respiratory tract infection    New Prescriptions Current Discharge Medication List     I personally performed the services described in this documentation, which was scribed in my presence. The recorded information has been reviewed and is accurate.     Phyllis LefevreJulie Marirose Deveney, MD 11/29/16 [phone]

## 2016-11-29 NOTE — ED Triage Notes (Signed)
Patient complains of generalized body aches with headache x 1 week. States she has been out of her meds x 3 weeks. Both her BP and diabetes meds.

## 2016-12-10 ENCOUNTER — Ambulatory Visit: Payer: Self-pay | Admitting: Family Medicine

## 2017-04-25 ENCOUNTER — Emergency Department (HOSPITAL_COMMUNITY)
Admission: EM | Admit: 2017-04-25 | Discharge: 2017-04-25 | Disposition: A | Discharge status: Home / Self Care | Payer: Self-pay | Attending: Emergency Medicine | Admitting: Emergency Medicine

## 2017-04-25 ENCOUNTER — Encounter (HOSPITAL_COMMUNITY): Payer: Self-pay | Admitting: Emergency Medicine

## 2017-04-25 DIAGNOSIS — R739 Hyperglycemia, unspecified: Secondary | ICD-10-CM | POA: Insufficient documentation

## 2017-04-25 DIAGNOSIS — Z7984 Long term (current) use of oral hypoglycemic drugs: Secondary | ICD-10-CM | POA: Insufficient documentation

## 2017-04-25 DIAGNOSIS — I10 Essential (primary) hypertension: Secondary | ICD-10-CM | POA: Insufficient documentation

## 2017-04-25 DIAGNOSIS — E119 Type 2 diabetes mellitus without complications: Secondary | ICD-10-CM | POA: Insufficient documentation

## 2017-04-25 LAB — COMPREHENSIVE METABOLIC PANEL
ALK PHOS: 42 U/L (ref 38–126)
ALT: 40 U/L (ref 14–54)
AST: 60 U/L — ABNORMAL HIGH (ref 15–41)
Albumin: 3.7 g/dL (ref 3.5–5.0)
Anion gap: 12 (ref 5–15)
BUN: 7 mg/dL (ref 6–20)
CALCIUM: 8.6 mg/dL — AB (ref 8.9–10.3)
CHLORIDE: 99 mmol/L — AB (ref 101–111)
CO2: 23 mmol/L (ref 22–32)
CREATININE: 0.57 mg/dL (ref 0.44–1.00)
GFR calc Af Amer: >60 mL/min (ref >60)
GFR calc non Af Amer: >60 mL/min (ref >60)
Glucose, Bld: 300 mg/dL — ABNORMAL HIGH (ref 65–99)
Potassium: 3.8 mmol/L (ref 3.5–5.1)
SODIUM: 134 mmol/L — AB (ref 135–145)
Total Bilirubin: 1 mg/dL (ref 0.3–1.2)
Total Protein: 6.8 g/dL (ref 6.5–8.1)

## 2017-04-25 LAB — LIPASE, BLOOD: LIPASE: 29 U/L (ref 11–51)

## 2017-04-25 LAB — CBC
HCT: 39.5 % (ref 36.0–46.0)
Hemoglobin: 12.7 g/dL (ref 12.0–15.0)
MCH: 27.7 pg (ref 26.0–34.0)
MCHC: 32.2 g/dL (ref 30.0–36.0)
MCV: 86.2 fL (ref 78.0–100.0)
PLATELETS: 209 10*3/uL (ref 150–400)
RBC: 4.58 MIL/uL (ref 3.87–5.11)
RDW: 12.2 % (ref 11.5–15.5)
WBC: 5 10*3/uL (ref 4.0–10.5)

## 2017-04-25 LAB — CBG MONITORING, ED: GLUCOSE-CAPILLARY: 211 mg/dL — AB (ref 65–99)

## 2017-04-25 MED ORDER — AMLODIPINE BESYLATE 5 MG PO TABS
10.0000 mg | ORAL_TABLET | Freq: Once | ORAL | Status: AC
  Administered 2017-04-25: 10 mg via ORAL
  Filled 2017-04-25: qty 2

## 2017-04-25 MED ORDER — ACETAMINOPHEN 500 MG PO TABS
1000.0000 mg | ORAL_TABLET | Freq: Once | ORAL | Status: AC
  Administered 2017-04-25: 1000 mg via ORAL
  Filled 2017-04-25: qty 2

## 2017-04-25 MED ORDER — METFORMIN HCL 500 MG PO TABS: 500.0000 mg | ORAL_TABLET | Freq: Two times a day (BID) | ORAL | 0 refills | Status: DC

## 2017-04-25 MED ORDER — INSULIN ASPART 100 UNIT/ML ~~LOC~~ SOLN
8.0000 [IU] | Freq: Once | SUBCUTANEOUS | Status: AC
  Administered 2017-04-25: 8 [IU] via INTRAVENOUS
  Filled 2017-04-25: qty 1

## 2017-04-25 MED ORDER — AMLODIPINE BESYLATE 10 MG PO TABS: 10.0000 mg | ORAL_TABLET | Freq: Every day | ORAL | 0 refills | Status: DC

## 2017-04-25 MED ORDER — SODIUM CHLORIDE 0.9 % IV BOLUS (SEPSIS)
1000.0000 mL | Freq: Once | INTRAVENOUS | Status: AC
  Administered 2017-04-25: 1000 mL via INTRAVENOUS

## 2017-04-25 MED ORDER — LISINOPRIL 10 MG PO TABS: 10.0000 mg | ORAL_TABLET | Freq: Every day | ORAL | 0 refills | Status: DC

## 2017-04-25 MED ORDER — ONDANSETRON HCL 4 MG/2ML IJ SOLN
4.0000 mg | Freq: Once | INTRAMUSCULAR | Status: AC
  Administered 2017-04-25: 4 mg via INTRAVENOUS
  Filled 2017-04-25: qty 2

## 2017-04-25 MED FILL — ?AMLODIPINE BESYLATE 10 MG: 10 | 30 days supply | Qty: 30 | Fill #0

## 2017-04-25 MED FILL — ?METFORMIN HCL 500MG TABLET: 500 | 30 days supply | Qty: 60 | Fill #0

## 2017-04-25 MED FILL — ?LISINOPRIL 10 MG TABLET: 10 | 30 days supply | Qty: 30 | Fill #0

## 2017-04-25 NOTE — Discharge Instructions (Signed)
It was our pleasure to provide your ER care today - we hope that you feel better.  Rest. Drink plenty of fluids.  Take diabetic medication as prescribed, follow diabetic eating plan. Check blood sugars 4x/day and record values. Follow up with primary care doctor in 1 week.  For blood pressure, take medication as prescribed, limit salt intake, and follow up with primary care doctor in the coming week.  Return to ER if worse, new symptoms, fevers, weak/fainting, other concern.

## 2017-04-25 NOTE — ED Triage Notes (Signed)
Pt sts N/V/D x 3 days  

## 2017-04-25 NOTE — ED Provider Notes (Signed)
[facility]-EMERGENCY DEPT Provider Note   CSN: [id number] Arrival date & time: 04/25/17  1324     History   Chief Complaint Chief Complaint  Patient presents with  . Emesis    HPI Phyllis Kemp is a 46 y.o. female.  Patient w hx htn, c/o out of meds for past few weeks. Also states in past 2 days, intermittent nausea and vomiting. Emesis clear to yellowish, no bloody or bilious emesis. Denies abd pain. Had a couple loose stools. No constipation. No blood per rectum.  Denies back/flank pain. No fever or chills. No vaginal discharge or bleeding. Remote hx hysterectomy. Denies chest pain or sob. No known bad food ingestion or ill contacts.    The history is provided by the patient.  Emesis   Pertinent negatives include no abdominal pain, no chills, no fever and no headaches.    Past Medical History:  Diagnosis Date  . Diabetes mellitus without complication (HCC) 2003  . Hypertension 2003    Patient Active Problem List   Diagnosis Date Noted  . Hyperlipidemia associated with type 2 diabetes mellitus (HCC) 10/19/2014  . DM2 (diabetes mellitus, type 2) (HCC) 10/18/2014  . HTN (hypertension) 10/18/2014  . Left foot pain 10/18/2014  . ETOH abuse 10/18/2014    Past Surgical History:  Procedure Laterality Date  . ABDOMINAL HYSTERECTOMY  2006   partial for fibroid tumors, still has cervix and ovaries   . APPENDECTOMY  1985    OB History    No data available       Home Medications    Prior to Admission medications   Medication Sig Start Date End Date Taking? Authorizing Provider  amLODipine (NORVASC) 10 MG tablet Take 1 tablet (10 mg total) by mouth daily. Pt needs OV for future refills 11/29/16   Jacalyn Lefevre, MD  ibuprofen (ADVIL,MOTRIN) 200 MG tablet Take 400 mg by mouth every 6 (six) hours as needed for headache (pain).     [provider]  lisinopril (PRINIVIL,ZESTRIL) 10 MG tablet Take 1 tablet (10 mg total) by mouth daily. 11/29/16   Jacalyn Lefevre, MD    metFORMIN (GLUMETZA) 1000 MG (MOD) 24 hr tablet Take 1 tablet (1,000 mg total) by mouth daily. 11/29/16   Jacalyn Lefevre, MD    Family History Family History  Problem Relation Age of Onset  . Diabetes Mother   . Hypertension Mother   . Cancer Maternal Aunt        breast   . Depression Maternal Grandmother     Social History Social History  Substance Use Topics  . Smoking status: Never Smoker  . Smokeless tobacco: Never Used  . Alcohol use 0.0 oz/week     Comment: socially     Allergies   Patient has no known allergies.   Review of Systems Review of Systems  Constitutional: Negative for chills and fever.  HENT: Negative for sore throat.   Eyes: Negative for redness.  Respiratory: Negative for shortness of breath.   Cardiovascular: Negative for chest pain.  Gastrointestinal: Positive for vomiting. Negative for abdominal pain.  Endocrine: Negative for polyuria.  Genitourinary: Negative for flank pain.  Musculoskeletal: Negative for back pain and neck pain.  Skin: Negative for rash.  Neurological: Negative for headaches.  Hematological: Does not bruise/bleed easily.  Psychiatric/Behavioral: Negative for confusion.     Physical Exam Updated Vital Signs BP (!) 170/102 (BP Location: Left Arm)   Pulse 100   Temp 98.4 F (36.9 C) (Oral)   Resp 16  Ht 1.702 m (5\' 7" )   Wt 89 kg (196 lb 3.2 oz)   SpO2 95%   BMI 30.73 kg/m   Physical Exam  Constitutional: She appears well-developed and well-nourished. No distress.  HENT:  Mouth/Throat: Oropharynx is clear and moist.  Eyes: Conjunctivae are normal. No scleral icterus.  Neck: Neck supple. No tracheal deviation present. No thyromegaly present.  Cardiovascular: Normal rate, regular rhythm, normal heart sounds and intact distal pulses.  Exam reveals no gallop and no friction rub.   No murmur heard. Pulmonary/Chest: Effort normal and breath sounds normal. No respiratory distress.  Abdominal: Soft. Normal appearance  and bowel sounds are normal. She exhibits no distension and no mass. There is no tenderness. There is no rebound and no guarding. No hernia.  Genitourinary:  Genitourinary Comments: No cva tenderness  Musculoskeletal: She exhibits no edema.  Neurological: She is alert.  Skin: Skin is warm and dry. No rash noted. She is not diaphoretic.  Psychiatric: She has a normal mood and affect.  Nursing note and vitals reviewed.    ED Treatments / Results  Labs (all labs ordered are listed, but only abnormal results are displayed) Results for orders placed or performed during the hospital encounter of 04/25/17  Lipase, blood  Result Value Ref Range   Lipase 29 11 - 51 U/L  Comprehensive metabolic panel  Result Value Ref Range   Sodium 134 (L) 135 - 145 mmol/L   Potassium 3.8 3.5 - 5.1 mmol/L   Chloride 99 (L) 101 - 111 mmol/L   CO2 23 22 - 32 mmol/L   Glucose, Bld 300 (H) 65 - 99 mg/dL   BUN 7 6 - 20 mg/dL   Creatinine, Ser 1.610.57 0.44 - 1.00 mg/dL   Calcium 8.6 (L) 8.9 - 10.3 mg/dL   Total Protein 6.8 6.5 - 8.1 g/dL   Albumin 3.7 3.5 - 5.0 g/dL   AST 60 (H) 15 - 41 U/L   ALT 40 14 - 54 U/L   Alkaline Phosphatase 42 38 - 126 U/L   Total Bilirubin 1.0 0.3 - 1.2 mg/dL   GFR calc non Af Amer >60 >60 mL/min   GFR calc Af Amer >60 >60 mL/min   Anion gap 12 5 - 15  CBC  Result Value Ref Range   WBC 5.0 4.0 - 10.5 K/uL   RBC 4.58 3.87 - 5.11 MIL/uL   Hemoglobin 12.7 12.0 - 15.0 g/dL   HCT 09.639.5 04.536.0 - 40.946.0 %   MCV 86.2 78.0 - 100.0 fL   MCH 27.7 26.0 - 34.0 pg   MCHC 32.2 30.0 - 36.0 g/dL   RDW 81.112.2 91.411.5 - 78.215.5 %   Platelets 209 150 - 400 K/uL  CBG monitoring, ED  Result Value Ref Range   Glucose-Capillary 211 (H) 65 - 99 mg/dL    EKG  EKG Interpretation None       Radiology No results found.  Procedures Procedures (including critical care time)  Medications Ordered in ED Medications  sodium chloride 0.9 % bolus 1,000 mL (not administered)  ondansetron (ZOFRAN)  injection 4 mg (not administered)     Initial Impression / Assessment and Plan / ED Course  I have reviewed the triage vital signs and the nursing notes.  Pertinent labs & imaging results that were available during my care of the patient were reviewed by me and considered in my medical decision making (see chart for details).  1 liter ns bolus. zofran iv.  Reviewed nursing notes and  prior charts for additional history.   Labs sent.  novolog sq.   Given dose of her home bp med.  Recheck hr 90.  bp improved.  Ambulatory w steady gait.  Pt requests refill of her lisinopril, metformin and amlodipine.   Pt currently appears stable for d/c.     Final Clinical Impressions(s) / ED Diagnoses   Final diagnoses:  None    New Prescriptions New Prescriptions   No medications on file     Cathren Laine, MD 04/25/17 1232

## 2017-08-13 ENCOUNTER — Emergency Department (HOSPITAL_COMMUNITY)
Admission: EM | Admit: 2017-08-13 | Discharge: 2017-08-13 | Disposition: A | Discharge status: Home / Self Care | Payer: Self-pay | Attending: Emergency Medicine | Admitting: Emergency Medicine

## 2017-08-13 ENCOUNTER — Emergency Department (HOSPITAL_COMMUNITY): Payer: Self-pay

## 2017-08-13 ENCOUNTER — Encounter (HOSPITAL_COMMUNITY): Payer: Self-pay | Admitting: Emergency Medicine

## 2017-08-13 DIAGNOSIS — Z79899 Other long term (current) drug therapy: Secondary | ICD-10-CM | POA: Insufficient documentation

## 2017-08-13 DIAGNOSIS — Z7982 Long term (current) use of aspirin: Secondary | ICD-10-CM | POA: Insufficient documentation

## 2017-08-13 DIAGNOSIS — Z7984 Long term (current) use of oral hypoglycemic drugs: Secondary | ICD-10-CM | POA: Insufficient documentation

## 2017-08-13 DIAGNOSIS — R51 Headache: Secondary | ICD-10-CM | POA: Insufficient documentation

## 2017-08-13 DIAGNOSIS — I10 Essential (primary) hypertension: Secondary | ICD-10-CM | POA: Insufficient documentation

## 2017-08-13 DIAGNOSIS — E119 Type 2 diabetes mellitus without complications: Secondary | ICD-10-CM | POA: Insufficient documentation

## 2017-08-13 LAB — CBC WITH DIFFERENTIAL/PLATELET
BASOS ABS: 0 10*3/uL (ref 0.0–0.1)
Basophils Relative: 1 %
Eosinophils Absolute: 0.1 10*3/uL (ref 0.0–0.7)
Eosinophils Relative: 2 %
HEMATOCRIT: 37.1 % (ref 36.0–46.0)
HEMOGLOBIN: 12 g/dL (ref 12.0–15.0)
LYMPHS PCT: 30 %
Lymphs Abs: 1.6 10*3/uL (ref 0.7–4.0)
MCH: 28.3 pg (ref 26.0–34.0)
MCHC: 32.3 g/dL (ref 30.0–36.0)
MCV: 87.5 fL (ref 78.0–100.0)
MONO ABS: 0.3 10*3/uL (ref 0.1–1.0)
MONOS PCT: 6 %
NEUTROS ABS: 3.1 10*3/uL (ref 1.7–7.7)
NEUTROS PCT: 61 %
Platelets: 194 10*3/uL (ref 150–400)
RBC: 4.24 MIL/uL (ref 3.87–5.11)
RDW: 12.6 % (ref 11.5–15.5)
WBC: 5.1 10*3/uL (ref 4.0–10.5)

## 2017-08-13 LAB — BASIC METABOLIC PANEL
ANION GAP: 8 (ref 5–15)
BUN: 7 mg/dL (ref 6–20)
CO2: 26 mmol/L (ref 22–32)
Calcium: 8.6 mg/dL — ABNORMAL LOW (ref 8.9–10.3)
Chloride: 99 mmol/L — ABNORMAL LOW (ref 101–111)
Creatinine, Ser: 0.63 mg/dL (ref 0.44–1.00)
Glucose, Bld: 312 mg/dL — ABNORMAL HIGH (ref 65–99)
POTASSIUM: 3.7 mmol/L (ref 3.5–5.1)
Sodium: 133 mmol/L — ABNORMAL LOW (ref 135–145)

## 2017-08-13 LAB — URINALYSIS, ROUTINE W REFLEX MICROSCOPIC
BILIRUBIN URINE: NEGATIVE
Bacteria, UA: NONE SEEN
KETONES UR: NEGATIVE mg/dL
LEUKOCYTES UA: NEGATIVE
NITRITE: NEGATIVE
PH: 6 (ref 5.0–8.0)
Protein, ur: NEGATIVE mg/dL
Specific Gravity, Urine: 1.017 (ref 1.005–1.030)

## 2017-08-13 LAB — I-STAT BETA HCG BLOOD, ED (MC, WL, AP ONLY): I-stat hCG, quantitative: <5 m[IU]/mL (ref <5)

## 2017-08-13 MED ORDER — LISINOPRIL 10 MG PO TABS
10.0000 mg | ORAL_TABLET | Freq: Once | ORAL | Status: AC
  Administered 2017-08-13: 10 mg via ORAL
  Filled 2017-08-13: qty 1

## 2017-08-13 MED ORDER — AMLODIPINE BESYLATE 5 MG PO TABS
10.0000 mg | ORAL_TABLET | Freq: Once | ORAL | Status: AC
  Administered 2017-08-13: 10 mg via ORAL
  Filled 2017-08-13: qty 2

## 2017-08-13 MED ORDER — AMLODIPINE BESYLATE 10 MG PO TABS: 10.0000 mg | ORAL_TABLET | Freq: Every day | ORAL | 0 refills | Status: DC

## 2017-08-13 MED ORDER — LISINOPRIL 10 MG PO TABS: 10.0000 mg | ORAL_TABLET | Freq: Every day | ORAL | 1 refills | Status: DC

## 2017-08-13 MED ORDER — METFORMIN HCL 500 MG PO TABS: 500.0000 mg | ORAL_TABLET | Freq: Two times a day (BID) | ORAL | 0 refills | Status: DC

## 2017-08-13 NOTE — ED Notes (Signed)
Esignature not available. Pt given D/C instructions and is ready for discharge.

## 2017-08-13 NOTE — Discharge Instructions (Addendum)
Do not hesitate to return to the Emergency Department for any new, worsening or concerning symptoms.  ° °If you do not have a primary care doctor you can establish one at the  ° °[facility]: °[address] °[place] [address] °[phone] ° °After you establish care. Let them know you were seen in the emergency room. They must obtain records for further management.  ° °Please follow with your primary care doctor in the next 2 days for a check-up. They must obtain records for further management.  ° °Do not hesitate to return to the Emergency Department for any new, worsening or concerning symptoms.  ° ° °

## 2017-08-13 NOTE — ED Triage Notes (Signed)
Pt state she has been out of her blood pressure medication for at least 2 weeks. Pt states for about 2 days she has felt like her blood pressure was high and has a headache.

## 2017-08-13 NOTE — ED Provider Notes (Signed)
[facility]   CSN: [id number] Arrival date & time: 08/13/17  0720     History   Chief Complaint Chief Complaint  Patient presents with  . Hypertension    HPI  Blood pressure (!) 167/93, pulse (!) 102, temperature 98 F (36.7 C), temperature source Oral, resp. rate 16, SpO2 100 %.  Phyllis Kemp is a 46 y.o. female complaining of running out of blood pressure medication several weeks ago she states that she was working and could not follow with primary care, she has seen the [facility] in the past.  She denies chest pain, shortness of breath, nausea vomiting, change in bladder habits.  She endorses a mild global headache but denies any change in vision, dysarthria, ataxia, unilateral weakness.  She states that she is about to run out of her metformin as well.  Past Medical History:  Diagnosis Date  . Diabetes mellitus without complication (HCC) 2003  . Hypertension 2003    Patient Active Problem List   Diagnosis Date Noted  . Hyperlipidemia associated with type 2 diabetes mellitus (HCC) 10/19/2014  . DM2 (diabetes mellitus, type 2) (HCC) 10/18/2014  . HTN (hypertension) 10/18/2014  . Left foot pain 10/18/2014  . ETOH abuse 10/18/2014    Past Surgical History:  Procedure Laterality Date  . ABDOMINAL HYSTERECTOMY  2006   partial for fibroid tumors, still has cervix and ovaries   . APPENDECTOMY  1985    OB History    No data available       Home Medications    Prior to Admission medications   Medication Sig Start Date End Date Taking? Authorizing Provider  aspirin EC 81 MG tablet Take 81 mg daily by mouth.   Yes [provider]  Multiple Vitamin (MULTIVITAMIN) tablet Take 1 tablet by mouth daily.   Yes [provider]  amLODipine (NORVASC) 10 MG tablet Take 1 tablet (10 mg total) daily by mouth. 08/13/17   Bryan Goin, Joni ReiningNicole, PA-C  lisinopril (PRINIVIL,ZESTRIL) 10 MG tablet Take 1 tablet  (10 mg total) daily by mouth. 08/13/17   Rollins Wrightson, Joni ReiningNicole, PA-C  metFORMIN (GLUCOPHAGE) 500 MG tablet Take 1 tablet (500 mg total) 2 (two) times daily with a meal by mouth. 08/13/17   Gerturde Kuba, Joni ReiningNicole, PA-C    Family History Family History  Problem Relation Age of Onset  . Diabetes Mother   . Hypertension Mother   . Cancer Maternal Aunt        breast   . Depression Maternal Grandmother     Social History Social History   Tobacco Use  . Smoking status: Never Smoker  . Smokeless tobacco: Never Used  Substance Use Topics  . Alcohol use: Yes    Alcohol/week: 0.0 oz    Comment: socially  . Drug use: No     Allergies   Patient has no known allergies.   Review of Systems Review of Systems  A complete review of systems was obtained and all systems are negative except as noted in the HPI and PMH.   Physical Exam Updated Vital Signs BP (!) 167/93 (BP Location: Left Arm)   Pulse (!) 102   Temp 98 F (36.7 C) (Oral)   Resp 16   SpO2 100%   Physical Exam  Constitutional: She is oriented to person, place, and time. She appears well-developed and well-nourished. No distress.  HENT:  Head: Normocephalic and atraumatic.  Mouth/Throat: Oropharynx is clear and moist.  Eyes: Conjunctivae and EOM are  normal. Pupils are equal, round, and reactive to light.  No TTP of maxillary or frontal sinuses  No TTP or induration of temporal arteries bilaterally  Neck: Normal range of motion. Neck supple.  FROM to C-spine. Pt can touch chin to chest without discomfort. No TTP of midline cervical spine.   Cardiovascular: Normal rate, regular rhythm and intact distal pulses.  Pulmonary/Chest: Effort normal and breath sounds normal. No stridor. No respiratory distress. She has no wheezes. She has no rales. She exhibits no tenderness.  Abdominal: Soft. Bowel sounds are normal. She exhibits no distension and no mass. There is no tenderness. There is no rebound and no guarding. No hernia.    Musculoskeletal: Normal range of motion. She exhibits no edema or tenderness.  Neurological: She is alert and oriented to person, place, and time. No cranial nerve deficit.  II-Visual fields grossly intact. III/IV/VI-Extraocular movements intact.  Pupils reactive bilaterally. V/VII-Smile symmetric, equal eyebrow raise,  facial sensation intact VIII- Hearing grossly intact IX/X-Normal gag XI-bilateral shoulder shrug XII-midline tongue extension Motor: 5/5 bilaterally with normal tone and bulk Cerebellar: Normal finger-to-nose  and normal heel-to-shin test.   Romberg negative Ambulates with a coordinated gait   Skin: Capillary refill takes less than 2 seconds. She is not diaphoretic.  Psychiatric: She has a normal mood and affect.  Nursing note and vitals reviewed.    ED Treatments / Results  Labs (all labs ordered are listed, but only abnormal results are displayed) Labs Reviewed  BASIC METABOLIC PANEL - Abnormal; Notable for the following components:      Result Value   Sodium 133 (*)    Chloride 99 (*)    Glucose, Bld 312 (*)    Calcium 8.6 (*)    All other components within normal limits  URINALYSIS, ROUTINE W REFLEX MICROSCOPIC - Abnormal; Notable for the following components:   Color, Urine STRAW (*)    Glucose, UA >=500 (*)    Hgb urine dipstick SMALL (*)    Squamous Epithelial / LPF 0-5 (*)    All other components within normal limits  CBC WITH DIFFERENTIAL/PLATELET  I-STAT BETA HCG BLOOD, ED ([facility], [facility], [facility] ONLY)    EKG  EKG Interpretation None       Radiology Ct Head Wo Contrast  Result Date: 08/13/2017 CLINICAL DATA:  Headache EXAM: CT HEAD WITHOUT CONTRAST TECHNIQUE: Contiguous axial images were obtained from the base of the skull through the vertex without intravenous contrast. COMPARISON:  10/09/2013 FINDINGS: Brain: No evidence of acute infarction, hemorrhage, hydrocephalus, extra-axial collection or mass lesion/mass effect. Vascular: Negative for  hyperdense vessel Skull: Negative Sinuses/Orbits: Mild mucosal edema paranasal sinuses.  Normal orbit. Other: None IMPRESSION: Negative CT head Mild mucosal thickening in the paranasal sinuses. Electronically Signed   By: Marlan Palau M.D.   On: 08/13/2017 12:51    Procedures Procedures (including critical care time)  Medications Ordered in ED Medications  amLODipine (NORVASC) tablet 10 mg (10 mg Oral Given 08/13/17 1134)  lisinopril (PRINIVIL,ZESTRIL) tablet 10 mg (10 mg Oral Given 08/13/17 1134)     Initial Impression / Assessment and Plan / ED Course  I have reviewed the triage vital signs and the nursing notes.  Pertinent labs & imaging results that were available during my care of the patient were reviewed by me and considered in my medical decision making (see chart for details).    Vitals:   08/13/17 0749 08/13/17 1320  BP: (!) 182/101 (!) 167/93  Pulse: 100 (!) 102  Resp:  16 16  Temp: 98 F (36.7 C)   TempSrc: Oral   SpO2: 98% 100%    Medications  amLODipine (NORVASC) tablet 10 mg (10 mg Oral Given 08/13/17 1134)  lisinopril (PRINIVIL,ZESTRIL) tablet 10 mg (10 mg Oral Given 08/13/17 1134)    Phyllis Kemp is 46 y.o. female presenting with elevated blood pressure she has a headache, nonfocal neurologic exam.  Blood work urinalysis head CT negative.  Patient with no chest pain, blood pressure improved after home meds were administered.  Will refill her home meds and metformin, will [facility] with the [facility].   Evaluation does not show pathology that would require ongoing emergent intervention or inpatient treatment. Pt is hemodynamically stable and mentating appropriately. Discussed findings and plan with patient/guardian, who agrees with care plan. All questions answered. Return precautions discussed and outpatient follow up given.    Final Clinical Impressions(s) / ED Diagnoses   Final diagnoses:  Hypertension, unspecified type    ED Discharge Orders         Ordered    amLODipine (NORVASC) 10 MG tablet  Daily     08/13/17 1316    lisinopril (PRINIVIL,ZESTRIL) 10 MG tablet  Daily     08/13/17 1316    metFORMIN (GLUCOPHAGE) 500 MG tablet  2 times daily with meals     08/13/17 1322       Patrena Santalucia, Mardella Laymanicole, PA-C 08/13/17 1610    Arby BarrettePfeiffer, Marcy, MD 08/14/17 0945

## 2017-09-11 ENCOUNTER — Encounter: Payer: Self-pay | Admitting: Nurse Practitioner

## 2017-09-11 ENCOUNTER — Ambulatory Visit: Payer: Self-pay | Attending: Nurse Practitioner | Admitting: Nurse Practitioner

## 2017-09-11 VITALS — BP 154/94 | HR 101 | Temp 97.4°F | Ht 67.0 in | Wt 194.2 lb

## 2017-09-11 DIAGNOSIS — Z7982 Long term (current) use of aspirin: Secondary | ICD-10-CM | POA: Insufficient documentation

## 2017-09-11 DIAGNOSIS — Z7984 Long term (current) use of oral hypoglycemic drugs: Secondary | ICD-10-CM | POA: Insufficient documentation

## 2017-09-11 DIAGNOSIS — E1165 Type 2 diabetes mellitus with hyperglycemia: Secondary | ICD-10-CM | POA: Insufficient documentation

## 2017-09-11 DIAGNOSIS — Z0000 Encounter for general adult medical examination without abnormal findings: Secondary | ICD-10-CM

## 2017-09-11 DIAGNOSIS — E1169 Type 2 diabetes mellitus with other specified complication: Secondary | ICD-10-CM

## 2017-09-11 DIAGNOSIS — E118 Type 2 diabetes mellitus with unspecified complications: Secondary | ICD-10-CM

## 2017-09-11 DIAGNOSIS — E785 Hyperlipidemia, unspecified: Secondary | ICD-10-CM | POA: Insufficient documentation

## 2017-09-11 DIAGNOSIS — I10 Essential (primary) hypertension: Secondary | ICD-10-CM

## 2017-09-11 DIAGNOSIS — Z79899 Other long term (current) drug therapy: Secondary | ICD-10-CM | POA: Insufficient documentation

## 2017-09-11 LAB — POCT GLYCOSYLATED HEMOGLOBIN (HGB A1C): HEMOGLOBIN A1C: 10.7

## 2017-09-11 LAB — GLUCOSE, POCT (MANUAL RESULT ENTRY): POC GLUCOSE: 321 mg/dL — AB (ref 70–99)

## 2017-09-11 MED ORDER — AMLODIPINE BESYLATE 10 MG PO TABS: 10.0000 mg | ORAL_TABLET | Freq: Every day | ORAL | 0 refills | Status: DC

## 2017-09-11 MED ORDER — SITAGLIPTIN PHOS-METFORMIN HCL 50-1000 MG PO TABS: 1.0000 | ORAL_TABLET | Freq: Two times a day (BID) | ORAL | 2 refills | Status: DC

## 2017-09-11 MED ORDER — ATORVASTATIN CALCIUM 20 MG PO TABS: 20.0000 mg | ORAL_TABLET | Freq: Every day | ORAL | 3 refills | Status: DC

## 2017-09-11 MED ORDER — TRUEPLUS LANCETS 28G MISC: 3 refills | Status: DC

## 2017-09-11 MED ORDER — LISINOPRIL 40 MG PO TABS: 40.0000 mg | ORAL_TABLET | Freq: Every day | ORAL | 0 refills | Status: DC

## 2017-09-11 MED ORDER — TRUE METRIX METER W/DEVICE KIT: PACK | 0 refills | Status: DC

## 2017-09-11 MED ORDER — GLUCOSE BLOOD VI STRP: ORAL_STRIP | 12 refills | Status: DC

## 2017-09-11 MED FILL — ATORVASTATIN 20 MG TABLET: 20 | 30 days supply | Qty: 30 | Fill #0

## 2017-09-11 MED FILL — TRUEplus LANCETS 28G MISC: 30 days supply | Qty: 100 | Fill #0

## 2017-09-11 MED FILL — **JANUMET 50-1000 MG TABLET: 50-1000 | 15 days supply | Qty: 30 | Fill #0

## 2017-09-11 MED FILL — TRUE METRIX TEST STRIP: 30 days supply | Qty: 100 | Fill #0

## 2017-09-11 MED FILL — !TRUE METRIX BLOOD GLUCOSE: 1 days supply | Qty: 1 | Fill #0

## 2017-09-11 NOTE — Progress Notes (Signed)
Assessment & Plan:  Phyllis Kemp was seen today for establish care.  Diagnoses and all orders for this visit:  Essential hypertension -     lisinopril (PRINIVIL,ZESTRIL) 40 MG tablet; Take 1 tablet (40 mg total) by mouth daily. -     amLODipine (NORVASC) 10 MG tablet; Take 1 tablet (10 mg total) by mouth daily. -     CMP14+EGFR Continue all antihypertensives as prescribed.  Remember to bring in your blood pressure log with you for your follow up appointment.  DASH/Mediterranean Diets are healthier choices for HTN.    Type 2 diabetes mellitus with complication, unspecified whether long term insulin use (HCC) -     Glucose (CBG) -     HgB A1c -     sitaGLIPtin-metformin (JANUMET) 50-1000 MG tablet; Take 1 tablet by mouth 2 (two) times daily with a meal. -     Microalbumin / creatinine urine ratio -     CMP14+EGFR -     Blood Glucose Monitoring Suppl (TRUE METRIX METER) w/Device KIT; Use as instructed -     glucose blood (TRUE METRIX BLOOD GLUCOSE TEST) test strip; Use as instructed -     TRUEPLUS LANCETS 28G MISC; Use as instructed -     Ambulatory referral to Ophthalmology -     TSH Diabetes is poorly controlled. Advised patient to keep a fasting blood sugar log fast, 2 hours post lunch and bedtime which will be reviewed at the next office visit.   Hyperlipidemia associated with type 2 diabetes mellitus (HCC) -     atorvastatin (LIPITOR) 20 MG tablet; Take 1 tablet (20 mg total) by mouth daily. -     Lipid panel Work on eating a low fat, heart healthy diet and participate in regular aerobic exercise program to control as well. Exercise at least 150 minutes per week  Routine health maintenance -     HIV antibody (with reflex)     Patient has been counseled on age-appropriate routine health concerns for screening and prevention. These are reviewed and up-to-date. Referrals have been placed accordingly. Immunizations are up-to-date or declined.    Subjective:   Chief Complaint    Patient presents with  . Establish Care    Patient is here for diabetes and hypertension. Patient stated that she may need medicaion refills.    HPI Phyllis Kemp 46 y.o. female presents to office today to establish care. She has a history of HTN, HPL and DM. Reports to me that she has been utilizing urgent care clinics for her medical needs.  Her last office visit here was 08-2015. She has a long history of "running out of her medications" and not following up routinely with primary care for her chronic conditions.    Hypertension This is chronic condition. Onset 2003. Her blood pressure has been poorly controlled for a few years based on her chart review. She is non adherent with taking her medications as well as her diet. Today she reports that she hasn't taken her amlodipine for several days as she saw on Facebook that amlodipine causes cancer so she stopped taking it. She did bring her pill bottles with her today. However the dosage shows 64m of lisinopril and according to patient's previous records she was taking 40 mg of lisinopril. Will start her on 451mof lisinopril today and continue 1081mf amlodipine. She may need a beta blocker at her next office visit based on her tachycardia. She denies chest pain, shortness of breath, lightheadedness, dizziness  or bilateral lower extremity edema. She does not smoke cigarettes.  BP Readings from Last 3 Encounters:  09/11/17 (!) 154/94  08/13/17 (!) 167/93  04/25/17 (!) 165/96    Hyperlipidemia She reports taking a statin in the past but unsure of the name. Will order lipid panel today and start lipitor at a low intensity. She may need to have it increased to 22m however will start at 28mtoday. She denies any history of statin intolerance including myalgias. Risk factors for coronary event include HTN, HPL, DM, obesity and sedentary lifestyle.  Her most recent lipid panel 10-18-2014: Lab Results  Component Value Date   CHOL 216 (H) 10/18/2014    Lab Results  Component Value Date   HDL 64 10/18/2014   Lab Results  Component Value Date   LDLCALC NOT CALC 10/18/2014   Lab Results  Component Value Date   TRIG 783 (H) 10/18/2014   Lab Results  Component Value Date   CHOLHDL 3.4 10/18/2014     Diabetes Mellitus  Poorly controlled. She does not have a glucometer. Diet is very unhealthy and she does not exercise. She takes 100033murrently of metformin. Will try janumet today. Denies any visual disturbances. Endorses mild right great toe pain which has been an ongoing issue for her. She does have a history of onychomycosis however pain could also be related to diabetic neuropathy. Her last uric acid level was normal.  Lab Results  Component Value Date   HGBA1C 10.7 09/11/2017   Review of Systems  Constitutional: Negative for fever, malaise/fatigue and weight loss.  HENT: Negative.  Negative for nosebleeds.   Eyes: Negative.  Negative for blurred vision, double vision and photophobia.  Respiratory: Negative.  Negative for cough and shortness of breath.   Cardiovascular: Negative.  Negative for chest pain, palpitations and leg swelling.  Gastrointestinal: Negative.  Negative for abdominal pain, constipation, diarrhea, heartburn, nausea and vomiting.  Genitourinary: Negative.   Musculoskeletal: Positive for back pain. Negative for myalgias.  Skin: Positive for rash (both hands).  Neurological: Positive for tingling (right great toe). Negative for dizziness, focal weakness, seizures and headaches.  Endo/Heme/Allergies: Negative for environmental allergies.  Psychiatric/Behavioral: Negative for depression, hallucinations, memory loss, substance abuse and suicidal ideas. The patient is not nervous/anxious and does not have insomnia.     Past Medical History:  Diagnosis Date  . Diabetes mellitus without complication ([id number] Hypertension 2003    Past Surgical History:  Procedure Laterality Date  . ABDOMINAL  HYSTERECTOMY  2006   partial for fibroid tumors, still has cervix and ovaries   . APPENDECTOMY  1985    Family History  Problem Relation Age of Onset  . Diabetes Mother   . Hypertension Mother   . Cancer Maternal Aunt        breast   . Depression Maternal Grandmother     Social History Reviewed with no changes to be made today.   Outpatient Medications Prior to Visit  Medication Sig Dispense Refill  . aspirin EC 81 MG tablet Take 81 mg daily by mouth.    . metFORMIN (GLUCOPHAGE) 500 MG tablet Take 1 tablet (500 mg total) 2 (two) times daily with a meal by mouth. 60 tablet 0  . Multiple Vitamin (MULTIVITAMIN) tablet Take 1 tablet by mouth daily.    . aMarland KitchenLODipine (NORVASC) 10 MG tablet Take 1 tablet (10 mg total) daily by mouth. 30 tablet 0  . lisinopril (PRINIVIL,ZESTRIL) 10 MG tablet Take 1 tablet (10 mg  total) daily by mouth. 30 tablet 1   No facility-administered medications prior to visit.     No Known Allergies     Objective:    BP (!) 154/94 (BP Location: Right Arm, Cuff Size: Large)   Pulse (!) 101   Temp (!) 97.4 F (36.3 C) (Oral)   Ht 5' 7"  (1.702 m)   Wt 194 lb 3.2 oz (88.1 kg)   SpO2 99%   BMI 30.42 kg/m  Wt Readings from Last 3 Encounters:  09/11/17 194 lb 3.2 oz (88.1 kg)  04/25/17 196 lb 3.2 oz (89 kg)  03/12/16 200 lb (90.7 kg)    Physical Exam  Constitutional: She is oriented to person, place, and time. She appears well-developed and well-nourished. She is cooperative.  HENT:  Head: Normocephalic and atraumatic.  Eyes: EOM are normal.  Neck: Normal range of motion.  Cardiovascular: Normal rate, regular rhythm, normal heart sounds and intact distal pulses. Exam reveals no gallop and no friction rub.  No murmur heard. Pulmonary/Chest: Effort normal and breath sounds normal. No tachypnea. No respiratory distress. She has no decreased breath sounds. She has no wheezes. She has no rhonchi. She has no rales. She exhibits no tenderness.  Abdominal:  Soft. Bowel sounds are normal.  Musculoskeletal: Normal range of motion. She exhibits no edema.  Neurological: She is alert and oriented to person, place, and time. Coordination normal.  Skin: Skin is warm, dry and intact. Rash noted. Rash is macular (eczematous appearing rash on bilateral hands).  Psychiatric: She has a normal mood and affect. Her behavior is normal. Judgment and thought content normal.  Nursing note and vitals reviewed.      Patient has been counseled extensively about nutrition and exercise as well as the importance of adherence with medications and regular follow-up. The patient was given clear instructions to go to ER or return to medical center if symptoms don't improve, worsen or new problems develop. The patient verbalized understanding.   Follow-up: Return in about 3 weeks (around 10/02/2017) for HTN; needs physical and PAP as well.   Gildardo Pounds, FNP-BC [facility] [place], [place]   09/11/2017, 1:07 PM

## 2017-09-12 LAB — HIV ANTIBODY (ROUTINE TESTING W REFLEX): HIV SCREEN 4TH GENERATION: NONREACTIVE

## 2017-09-12 LAB — LIPID PANEL
Chol/HDL Ratio: 3.1 ratio (ref 0.0–4.4)
Cholesterol, Total: 213 mg/dL — ABNORMAL HIGH (ref 100–199)
HDL: 69 mg/dL (ref >39)
TRIGLYCERIDES: 412 mg/dL — AB (ref 0–149)

## 2017-09-12 LAB — CMP14+EGFR
A/G RATIO: 1.6 (ref 1.2–2.2)
ALBUMIN: 4.4 g/dL (ref 3.5–5.5)
ALT: 45 IU/L — ABNORMAL HIGH (ref 0–32)
AST: 47 IU/L — ABNORMAL HIGH (ref 0–40)
Alkaline Phosphatase: 52 IU/L (ref 39–117)
BILIRUBIN TOTAL: 0.4 mg/dL (ref 0.0–1.2)
BUN/Creatinine Ratio: 13 (ref 9–23)
BUN: 7 mg/dL (ref 6–24)
CO2: 23 mmol/L (ref 20–29)
CREATININE: 0.56 mg/dL — AB (ref 0.57–1.00)
Calcium: 9.1 mg/dL (ref 8.7–10.2)
Chloride: 94 mmol/L — ABNORMAL LOW (ref 96–106)
GFR calc Af Amer: 129 mL/min/{1.73_m2} (ref >59)
GFR calc non Af Amer: 112 mL/min/{1.73_m2} (ref >59)
Globulin, Total: 2.7 g/dL (ref 1.5–4.5)
Glucose: 316 mg/dL — ABNORMAL HIGH (ref 65–99)
Potassium: 3.8 mmol/L (ref 3.5–5.2)
Sodium: 134 mmol/L (ref 134–144)
TOTAL PROTEIN: 7.1 g/dL (ref 6.0–8.5)

## 2017-09-12 LAB — MICROALBUMIN / CREATININE URINE RATIO
Creatinine, Urine: 82.5 mg/dL
MICROALB/CREAT RATIO: 85.7 mg/g{creat} — AB (ref 0.0–30.0)
Microalbumin, Urine: 70.7 ug/mL

## 2017-09-12 LAB — TSH: TSH: 0.897 u[IU]/mL (ref 0.450–4.500)

## 2017-09-15 ENCOUNTER — Other Ambulatory Visit: Payer: Self-pay | Admitting: Nurse Practitioner

## 2017-09-15 DIAGNOSIS — I10 Essential (primary) hypertension: Secondary | ICD-10-CM

## 2017-09-15 MED ORDER — LISINOPRIL 40 MG PO TABS: 40.0000 mg | ORAL_TABLET | Freq: Every day | ORAL | 0 refills | Status: DC

## 2017-09-16 MED FILL — LISINOPRIL 40 MG TAB: 40 | 30 days supply | Qty: 30 | Fill #0

## 2017-09-18 ENCOUNTER — Telehealth: Payer: Self-pay

## 2017-09-18 NOTE — Telephone Encounter (Signed)
-----   Message from Claiborne RiggZelda W Fleming, NP sent at 09/15/2017 12:46 AM EST ----- Your lipid panel is abnormal. Triglycerides are still high. Please make sure you take your Lipitor every evening as prescribed. I have sent a new prescription for 40mg  instead of 20.  Your thyroid level is normal and your STD test for HIV was negative

## 2017-09-18 NOTE — Telephone Encounter (Signed)
Patient informed on lab result. Patient aware to pick up new Rx medications.  Patient verified DOB.

## 2017-09-23 ENCOUNTER — Telehealth: Payer: Self-pay

## 2017-09-23 ENCOUNTER — Other Ambulatory Visit: Payer: Self-pay | Admitting: Obstetrics and Gynecology

## 2017-09-23 DIAGNOSIS — Z1231 Encounter for screening mammogram for malignant neoplasm of breast: Secondary | ICD-10-CM

## 2017-09-23 NOTE — Telephone Encounter (Signed)
Patient called regarding her Lipitor 40 mg have not been sent in to her pharmacy.

## 2017-09-24 ENCOUNTER — Other Ambulatory Visit: Payer: Self-pay | Admitting: Nurse Practitioner

## 2017-09-24 DIAGNOSIS — E785 Hyperlipidemia, unspecified: Principal | ICD-10-CM

## 2017-09-24 DIAGNOSIS — E1169 Type 2 diabetes mellitus with other specified complication: Secondary | ICD-10-CM

## 2017-09-24 MED ORDER — ATORVASTATIN CALCIUM 40 MG PO TABS: 40.0000 mg | ORAL_TABLET | Freq: Every day | ORAL | 0 refills | Status: DC

## 2017-09-24 MED FILL — ?ATORVASTATIN 40MG TABLET: 40 | 30 days supply | Qty: 30 | Fill #0

## 2017-09-24 NOTE — Telephone Encounter (Signed)
Patient informed on Rx sent.  Patient verified DOB.

## 2017-09-24 NOTE — Telephone Encounter (Signed)
Order has been sent for 40mg  of lipitor to [facility] pharmacy

## 2017-09-27 ENCOUNTER — Encounter (HOSPITAL_COMMUNITY): Payer: Self-pay | Admitting: Emergency Medicine

## 2017-09-27 ENCOUNTER — Emergency Department (HOSPITAL_COMMUNITY)
Admission: EM | Admit: 2017-09-27 | Discharge: 2017-09-27 | Disposition: A | Discharge status: Home / Self Care | Payer: Self-pay | Attending: Emergency Medicine | Admitting: Emergency Medicine

## 2017-09-27 ENCOUNTER — Other Ambulatory Visit: Payer: Self-pay

## 2017-09-27 ENCOUNTER — Emergency Department (HOSPITAL_COMMUNITY): Payer: Self-pay

## 2017-09-27 DIAGNOSIS — I10 Essential (primary) hypertension: Secondary | ICD-10-CM | POA: Insufficient documentation

## 2017-09-27 DIAGNOSIS — E119 Type 2 diabetes mellitus without complications: Secondary | ICD-10-CM | POA: Insufficient documentation

## 2017-09-27 DIAGNOSIS — Y998 Other external cause status: Secondary | ICD-10-CM | POA: Insufficient documentation

## 2017-09-27 DIAGNOSIS — Z7984 Long term (current) use of oral hypoglycemic drugs: Secondary | ICD-10-CM | POA: Insufficient documentation

## 2017-09-27 DIAGNOSIS — Z79899 Other long term (current) drug therapy: Secondary | ICD-10-CM | POA: Insufficient documentation

## 2017-09-27 DIAGNOSIS — Y929 Unspecified place or not applicable: Secondary | ICD-10-CM | POA: Insufficient documentation

## 2017-09-27 DIAGNOSIS — S90852A Superficial foreign body, left foot, initial encounter: Secondary | ICD-10-CM | POA: Insufficient documentation

## 2017-09-27 DIAGNOSIS — W25XXXA Contact with sharp glass, initial encounter: Secondary | ICD-10-CM | POA: Insufficient documentation

## 2017-09-27 DIAGNOSIS — Z7982 Long term (current) use of aspirin: Secondary | ICD-10-CM | POA: Insufficient documentation

## 2017-09-27 DIAGNOSIS — Z5189 Encounter for other specified aftercare: Secondary | ICD-10-CM

## 2017-09-27 DIAGNOSIS — Y93G1 Activity, food preparation and clean up: Secondary | ICD-10-CM | POA: Insufficient documentation

## 2017-09-27 MED ORDER — LIDOCAINE-EPINEPHRINE-TETRACAINE (LET) SOLUTION
3.0000 mL | Freq: Once | NASAL | Status: AC
  Administered 2017-09-27: 3 mL via TOPICAL
  Filled 2017-09-27: qty 3

## 2017-09-27 MED ORDER — CEPHALEXIN 500 MG PO CAPS: 500.0000 mg | ORAL_CAPSULE | Freq: Four times a day (QID) | ORAL | 0 refills | Status: DC

## 2017-09-27 MED ORDER — BUPIVACAINE HCL (PF) 0.25 % IJ SOLN
50.0000 mL | Freq: Once | INTRAMUSCULAR | Status: AC
  Administered 2017-09-27: 50 mL
  Filled 2017-09-27: qty 60

## 2017-09-27 MED ORDER — IBUPROFEN 800 MG PO TABS: 800.0000 mg | ORAL_TABLET | Freq: Three times a day (TID) | ORAL | 0 refills | Status: DC

## 2017-09-27 MED FILL — IBUPROFEN 800 MG TABLET: 800 | 7 days supply | Qty: 21 | Fill #0

## 2017-09-27 MED FILL — CEPHALEXIN 500 MG CAPSULE: 500 | 5 days supply | Qty: 20 | Fill #0

## 2017-09-27 NOTE — ED Provider Notes (Signed)
[facility] EMERGENCY DEPARTMENT Provider Note   CSN: [id number] Arrival date & time: 09/27/17  2947     History   Chief Complaint Chief Complaint  Patient presents with  . Foreign Body   HPI  Blood pressure (!) 184/106, pulse (!) 107, temperature 99 F (37.2 C), temperature source Oral, resp. rate 18, height 5' 7"  (1.702 m), weight 88.5 kg (195 lb), SpO2 95 %.  Phyllis Kemp is a 46 y.o. female complaining of foreign body to left foot.  She states that she was washing out a glass pitcher pitcher shattered and she stepped on a piece of glass 2 days ago.  She feels that shard of glass is in the heel, she has tried to get it out and she is asked family members to get it out.  States her last tetanus shot was 1 year ago.  Pain is minimal but describes it as an irritation and she knows something is inside.  Past Medical History:  Diagnosis Date  . Diabetes mellitus without complication ([place]) 6546  . Hypertension 2003    Patient Active Problem List   Diagnosis Date Noted  . Hyperlipidemia associated with type 2 diabetes mellitus ([place]) 10/19/2014  . DM2 (diabetes mellitus, type 2) ([place]) 10/18/2014  . Essential hypertension 10/18/2014  . Left foot pain 10/18/2014  . ETOH abuse 10/18/2014    Past Surgical History:  Procedure Laterality Date  . ABDOMINAL HYSTERECTOMY  2006   partial for fibroid tumors, still has cervix and ovaries   . APPENDECTOMY  1985    OB History    No data available       Home Medications    Prior to Admission medications   Medication Sig Start Date End Date Taking? Authorizing Provider  amLODipine (NORVASC) 10 MG tablet Take 1 tablet (10 mg total) by mouth daily. 09/11/17 12/10/17  Gildardo Pounds, NP  aspirin EC 81 MG tablet Take 81 mg daily by mouth.    [provider]  atorvastatin (LIPITOR) 40 MG tablet Take 1 tablet (40 mg total) by mouth daily. 09/24/17 12/23/17  Gildardo Pounds, NP  Blood Glucose Monitoring Suppl  (TRUE METRIX METER) w/Device KIT Use as instructed 09/11/17   Gildardo Pounds, NP  cephALEXin (KEFLEX) 500 MG capsule Take 1 capsule (500 mg total) by mouth 4 (four) times daily. 09/27/17   Ryleigh Buenger, Elmyra Ricks, PA-C  glucose blood (TRUE METRIX BLOOD GLUCOSE TEST) test strip Use as instructed 09/11/17   Gildardo Pounds, NP  lisinopril (PRINIVIL,ZESTRIL) 40 MG tablet Take 1 tablet (40 mg total) by mouth daily. 09/15/17 12/14/17  Gildardo Pounds, NP  metFORMIN (GLUCOPHAGE) 500 MG tablet Take 1 tablet (500 mg total) 2 (two) times daily with a meal by mouth. 08/13/17   Chazz Philson, Elmyra Ricks, PA-C  Multiple Vitamin (MULTIVITAMIN) tablet Take 1 tablet by mouth daily.    [provider]  sitaGLIPtin-metformin (JANUMET) 50-1000 MG tablet Take 1 tablet by mouth 2 (two) times daily with a meal. 09/11/17 12/10/17  Gildardo Pounds, NP  TRUEPLUS LANCETS 28G MISC Use as instructed 09/11/17   Gildardo Pounds, NP    Family History Family History  Problem Relation Age of Onset  . Diabetes Mother   . Hypertension Mother   . Cancer Maternal Aunt        breast   . Depression Maternal Grandmother     Social History Social History   Tobacco Use  . Smoking status: Never Smoker  . Smokeless tobacco:  Never Used  Substance Use Topics  . Alcohol use: Yes    Alcohol/week: 0.0 oz    Comment: socially  . Drug use: No     Allergies   Patient has no known allergies.   Review of Systems Review of Systems   Physical Exam Updated Vital Signs BP (!) 187/111   Pulse (!) 102   Temp 99 F (37.2 C) (Oral)   Resp 14   Ht 5' 7"  (1.702 m)   Wt 88.5 kg (195 lb)   SpO2 100%   BMI 30.54 kg/m   Physical Exam  Constitutional: She is oriented to person, place, and time. She appears well-developed and well-nourished. No distress.  HENT:  Head: Normocephalic and atraumatic.  Mouth/Throat: Oropharynx is clear and moist.  Eyes: Conjunctivae and EOM are normal. Pupils are equal, round, and reactive to light.    Neck: Normal range of motion.  Cardiovascular: Normal rate, regular rhythm and intact distal pulses.  Pulmonary/Chest: Effort normal and breath sounds normal.  Abdominal: Soft. There is no tenderness.  Musculoskeletal: Normal range of motion.       Feet:  Neurological: She is alert and oriented to person, place, and time.  Skin: She is not diaphoretic.  Psychiatric: She has a normal mood and affect.  Nursing note and vitals reviewed.    ED Treatments / Results  Labs (all labs ordered are listed, but only abnormal results are displayed) Labs Reviewed - No data to display  EKG  EKG Interpretation None       Radiology Dg Foot Complete Left  Result Date: 09/27/2017 CLINICAL DATA:  Possible glass foreign body. EXAM: LEFT FOOT - COMPLETE 3+ VIEW COMPARISON:  No prior . FINDINGS: No radiopaque foreign body identified over the site of patient's soft tissue injury. Punctate calcification in the distal aspect of the left fifth digit, most likely tiny dystrophic calcification or film artifact. Vascular calcification noted. No acute bony abnormality. IMPRESSION: 1. No radiopaque foreign body noted the region the patient's injury. No acute bony abnormality. 2. Peripheral vascular disease. Electronically Signed   By: Marcello Moores  Register   On: 09/27/2017 07:48    Procedures .Foreign Body Removal Date/Time: 09/27/2017 9:02 AM Performed by: Monico Blitz, PA-C Authorized by: Monico Blitz, PA-C  Consent: Verbal consent not obtained. Consent given by: patient Body area: skin Anesthesia: local infiltration  Anesthesia: Local Anesthetic: bupivacaine 0.5% without epinephrine Anesthetic total: 10 mL  Sedation: Patient sedated: no  Complexity: simple Post-procedure assessment: foreign body not removed Patient tolerance: Patient tolerated the procedure well with no immediate complications Comments: Laceration slightly deepened and extended and probed with no tactile sensation of  glass no visualized foreign body.  Wound is irrigated extensively, dressed with antibiotic ointment and gauze.   (including critical care time)  Medications Ordered in ED Medications  lidocaine-EPINEPHrine-tetracaine (LET) solution (3 mLs Topical Given 09/27/17 0742)  bupivacaine (PF) (MARCAINE) 0.25 % injection 50 mL (50 mLs Infiltration Given 09/27/17 0837)     Initial Impression / Assessment and Plan / ED Course  I have reviewed the triage vital signs and the nursing notes.  Pertinent labs & imaging results that were available during my care of the patient were reviewed by me and considered in my medical decision making (see chart for details).     Vitals:   09/27/17 0654 09/27/17 0655 09/27/17 0838  BP:  (!) 184/106 (!) 187/111  Pulse:  (!) 107 (!) 102  Resp:  18 14  Temp:  99 F (37.2  C)   TempSrc:  Oral   SpO2:  95% 100%  Weight: 88.5 kg (195 lb)    Height: 5' 7"  (1.702 m)      Medications  lidocaine-EPINEPHrine-tetracaine (LET) solution (3 mLs Topical Given 09/27/17 0742)  bupivacaine (PF) (MARCAINE) 0.25 % injection 50 mL (50 mLs Infiltration Given 09/27/17 0837)    Phyllis Kemp is 46 y.o. female presenting with foreign body sensation to laceration left lateral heel.  Tetanus is up-to-date, no infection.  X-ray with no foreign body, I have advised the patient of this and she is insistent that there is something in the wound.  Given her diabetes and the fact that she and her family members keep trying to take out a presumed foreign body I will attempt an exploration of the wound under sterile conditions.  I will start her on antibiotics prophylactically.  Wound extended and probed with no foreign body appreciated, counseled her to not try to further find any foreign bodies in the wound and how to care for the laceration at home.  Evaluation does not show pathology that would require ongoing emergent intervention or inpatient treatment. Pt is hemodynamically stable and  mentating appropriately. Discussed findings and plan with patient/guardian, who agrees with care plan. All questions answered. Return precautions discussed and outpatient follow up given.    Final Clinical Impressions(s) / ED Diagnoses   Final diagnoses:  Visit for wound check    ED Discharge Orders        Ordered    cephALEXin (KEFLEX) 500 MG capsule  4 times daily     09/27/17 0857       Akemi Overholser, Charna Elizabeth 09/27/17 0263  Blood pressure significantly elevated today, she states that she took her blood pressure medications this morning, I advised her to follow closely for a blood pressure recheck with her primary care doctor.    Waynetta Pean 09/27/17 7858    Isla Pence, MD 09/27/17 [phone]

## 2017-09-27 NOTE — ED Triage Notes (Signed)
Pt reports she stepped on a piece of glass a few days ago. Small cut noted to L heel. States tetanus is up to date.

## 2017-09-27 NOTE — ED Notes (Signed)
Patient transported to X-ray 

## 2017-09-27 NOTE — ED Notes (Signed)
Pt. returned from XR. 

## 2017-09-27 NOTE — Discharge Instructions (Addendum)
Wash the affected area with soap and water and apply a thin layer of topical antibiotic ointment. Do this every 12 hours.  ° °Do not use rubbing alcohol or hydrogen peroxide.                       ° °Look for signs of infection: if you see redness, if the area becomes warm, if pain increases sharply, there is discharge (pus), if red streaks appear or you develop fever or vomiting, RETURN immediately to the Emergency Department  for a recheck.  ° °

## 2017-10-03 ENCOUNTER — Ambulatory Visit: Payer: Self-pay

## 2017-10-11 ENCOUNTER — Encounter: Payer: Self-pay | Admitting: Nurse Practitioner

## 2017-10-29 ENCOUNTER — Encounter: Payer: Self-pay | Admitting: Nurse Practitioner

## 2017-10-29 ENCOUNTER — Ambulatory Visit: Payer: Self-pay | Attending: Nurse Practitioner | Admitting: Nurse Practitioner

## 2017-10-29 ENCOUNTER — Other Ambulatory Visit: Payer: Self-pay

## 2017-10-29 VITALS — BP 181/97 | HR 108 | Temp 98.9°F | Ht 67.0 in | Wt 192.8 lb

## 2017-10-29 DIAGNOSIS — Z794 Long term (current) use of insulin: Secondary | ICD-10-CM | POA: Insufficient documentation

## 2017-10-29 DIAGNOSIS — I10 Essential (primary) hypertension: Secondary | ICD-10-CM | POA: Insufficient documentation

## 2017-10-29 DIAGNOSIS — Z0000 Encounter for general adult medical examination without abnormal findings: Secondary | ICD-10-CM

## 2017-10-29 DIAGNOSIS — E1165 Type 2 diabetes mellitus with hyperglycemia: Secondary | ICD-10-CM | POA: Insufficient documentation

## 2017-10-29 DIAGNOSIS — R000 Tachycardia, unspecified: Secondary | ICD-10-CM | POA: Insufficient documentation

## 2017-10-29 DIAGNOSIS — Z79899 Other long term (current) drug therapy: Secondary | ICD-10-CM | POA: Insufficient documentation

## 2017-10-29 DIAGNOSIS — Z9119 Patient's noncompliance with other medical treatment and regimen: Secondary | ICD-10-CM | POA: Insufficient documentation

## 2017-10-29 DIAGNOSIS — R9431 Abnormal electrocardiogram [ECG] [EKG]: Secondary | ICD-10-CM | POA: Insufficient documentation

## 2017-10-29 DIAGNOSIS — E1169 Type 2 diabetes mellitus with other specified complication: Secondary | ICD-10-CM

## 2017-10-29 DIAGNOSIS — E785 Hyperlipidemia, unspecified: Secondary | ICD-10-CM

## 2017-10-29 DIAGNOSIS — E118 Type 2 diabetes mellitus with unspecified complications: Secondary | ICD-10-CM | POA: Insufficient documentation

## 2017-10-29 DIAGNOSIS — Z7982 Long term (current) use of aspirin: Secondary | ICD-10-CM | POA: Insufficient documentation

## 2017-10-29 LAB — GLUCOSE, POCT (MANUAL RESULT ENTRY): POC GLUCOSE: 207 mg/dL — AB (ref 70–99)

## 2017-10-29 MED ORDER — CLONIDINE HCL 0.1 MG PO TABS
0.2000 mg | ORAL_TABLET | Freq: Once | ORAL | Status: AC
  Administered 2017-10-29: 0.2 mg via ORAL

## 2017-10-29 MED ORDER — GLIMEPIRIDE 4 MG PO TABS: 8.0000 mg | ORAL_TABLET | Freq: Every day | ORAL | 3 refills | Status: DC

## 2017-10-29 MED ORDER — AMLODIPINE BESYLATE 10 MG PO TABS: 10.0000 mg | ORAL_TABLET | Freq: Every day | ORAL | 0 refills | Status: DC

## 2017-10-29 MED ORDER — METFORMIN HCL 500 MG PO TABS: 1000.0000 mg | ORAL_TABLET | Freq: Two times a day (BID) | ORAL | 3 refills | Status: DC

## 2017-10-29 MED FILL — ?GLIMEPIRIDE 4 MG TABLET: 4 | 30 days supply | Qty: 60 | Fill #0

## 2017-10-29 MED FILL — LISINOPRIL 40 MG TAB: 40 | 30 days supply | Qty: 30 | Fill #1

## 2017-10-29 MED FILL — AMLODIPINE BESYLATE 10 MG T: 10 | 30 days supply | Qty: 30 | Fill #0

## 2017-10-29 MED FILL — ?METFORMIN HCL 500MG TABLET: 500 | 30 days supply | Qty: 120 | Fill #0

## 2017-10-29 NOTE — Patient Instructions (Signed)
Coping With Diabetes Diabetes (type 1 diabetes mellitus or type 2 diabetes mellitus) is a condition in which the body does not have enough of a hormone called insulin, or the body does not respond properly to insulin. Normally, insulin allows sugars (glucose) to enter cells in the body. The cells use glucose for energy. With diabetes, extra glucose builds up in the blood instead of going into cells, which results in high blood glucose (hyperglycemia). How to manage lifestyle changes Managing diabetes includes medical treatments as well as lifestyle changes. If diabetes is not managed well, serious physical and emotional complications can occur. Taking good care of yourself means that you are responsible for:  Monitoring glucose regularly.  Eating a healthy diet.  Exercising regularly.  Meeting with health care providers.  Taking medicines as directed.  Some people may feel a lot of stress about managing their diabetes. This is known as emotional distress, and it is very common. Living with diabetes can place you at risk for emotional distress, depression, or anxiety. These disorders can be confusing and can make diabetes management more difficult. How to recognize stress Emotional distress Symptoms of emotional distress include:  Anger about having a diagnosis of diabetes.  Fear or frustration about your diagnosis and the changes you need to make to manage the condition.  Being overly worried about the care that you need or the cost of the care you need.  Feeling like you caused your condition by doing something wrong.  Fear of unpredictable situations, like low or high blood glucose.  Feeling judged by your health care providers.  Feeling very alone with the disease.  Getting too tired or "burned out" with the demands of daily care.  Depression Having diabetes means that you are at a higher risk for depression. Having depression also means that you are at a higher risk for  diabetes. Your health care provider may test (screen) you for symptoms of depression. It is important to recognize depression symptoms and to start treatment for it soon after it is diagnosed. The following are some symptoms of depression:  Loss of interest in things that you used to enjoy.  Trouble sleeping, or often waking up early and not being able to get back to sleep.  A change in appetite.  Feeling tired most of the day.  Feeling nervous and anxious.  Feeling guilty and worrying that you are a burden to others.  Feeling depressed more often than you do not feel that way.  Thoughts of hurting yourself or feeling that you want to die.  If you have any of these symptoms for 2 weeks or longer, reach out to a health care provider. Where to find support  Ask your health care provider to recommend a therapist who understands both depression and diabetes.  Search for information and support from the American Diabetes Association: www.diabetes.org  Find a certified diabetes educator and make an appointment through [place] [facility] Diabetes Educators: www.diabeteseducator.org Follow these instructions at home: Managing emotional distress The following are some ways to manage emotional distress:  Talk with your health care provider or certified diabetes educator. Consider working with a counselor or therapist.  Learn as much as you can about diabetes and its treatment. Meet with a certified diabetes educator or take a class to learn how to manage your condition.  Keep a journal of your thoughts and concerns.  Accept that some things are out of your control.  Talk with other people who have diabetes. It  can help to talk with others about the emotional distress that you feel.  Find ways to manage stress that work for you. These may include art or music therapy, exercise, meditation, and hobbies.  Seek support from spiritual leaders, family, and friends.  General  instructions  Follow your diabetes management plan.  Keep all follow-up visits as told by your health care provider. This is important. Get help right away if:  You have thoughts about hurting yourself or others. If you ever feel like you may hurt yourself or others, or have thoughts about taking your own life, get help right away. You can go to your nearest emergency department or call:  Your local emergency services (911 in the [place]  A suicide crisis helpline, such as the [place] at 253-734-7846. This is open 24 hours a day.  Summary  Diabetes (type 1 diabetes mellitus or type 2 diabetes mellitus) is a condition in which the body does not have enough of a hormone called insulin, or the body does not respond properly to insulin.  Living with diabetes puts you at risk for medical issues, and it also puts you at risk for emotional issues such as emotional distress, depression, and anxiety.  Recognizing the symptoms of emotional distress and depression may help you avoid problems with your diabetes control. It is important to start treatment for emotional distress and depression soon after they are diagnosed.  Having diabetes means that you are at a higher risk for depression. Ask your health care provider to recommend a therapist who understands both depression and diabetes.  If you experience symptoms of emotional distress or depression, it is important to discuss this with your health care provider, certified diabetes educator, or therapist. This information is not intended to replace advice given to you by your health care provider. Make sure you discuss any questions you have with your health care provider. Document Released: 02/07/2017 Document Revised: 02/07/2017 Document Reviewed: 02/07/2017 Elsevier Interactive Patient Education  2018 [place] Eating Plan DASH stands for "Dietary Approaches to Stop Hypertension." The DASH eating plan  is a healthy eating plan that has been shown to reduce high blood pressure (hypertension). It may also reduce your risk for type 2 diabetes, heart disease, and stroke. The DASH eating plan may also help with weight loss. What are tips for following this plan? General guidelines  Avoid eating more than 2,300 mg (milligrams) of salt (sodium) a day. If you have hypertension, you may need to reduce your sodium intake to 1,500 mg a day.  Limit alcohol intake to no more than 1 drink a day for nonpregnant women and 2 drinks a day for men. One drink equals 12 oz of beer, 5 oz of wine, or 1 oz of hard liquor.  Work with your health care provider to maintain a healthy body weight or to lose weight. Ask what an ideal weight is for you.  Get at least 30 minutes of exercise that causes your heart to beat faster (aerobic exercise) most days of the week. Activities may include walking, swimming, or biking.  Work with your health care provider or diet and nutrition specialist (dietitian) to adjust your eating plan to your individual calorie needs. Reading food labels  Check food labels for the amount of sodium per serving. Choose foods with less than 5 percent of the Daily Value of sodium. Generally, foods with less than 300 mg of sodium per serving fit into this eating plan.  To find whole grains, look for the word "whole" as the first word in the ingredient list. Shopping  Buy products labeled as "low-sodium" or "no salt added."  Buy fresh foods. Avoid canned foods and premade or frozen meals. Cooking  Avoid adding salt when cooking. Use salt-free seasonings or herbs instead of table salt or sea salt. Check with your health care provider or pharmacist before using salt substitutes.  Do not fry foods. Cook foods using healthy methods such as baking, boiling, grilling, and broiling instead.  Cook with heart-healthy oils, such as olive, canola, soybean, or sunflower oil. Meal planning   Eat a  balanced diet that includes: ? 5 or more servings of fruits and vegetables each day. At each meal, try to fill half of your plate with fruits and vegetables. ? Up to 6-8 servings of whole grains each day. ? Less than 6 oz of lean meat, poultry, or fish each day. A 3-oz serving of meat is about the same size as a deck of cards. One egg equals 1 oz. ? 2 servings of low-fat dairy each day. ? A serving of nuts, seeds, or beans 5 times each week. ? Heart-healthy fats. Healthy fats called Omega-3 fatty acids are found in foods such as flaxseeds and coldwater fish, like sardines, salmon, and mackerel.  Limit how much you eat of the following: ? Canned or prepackaged foods. ? Food that is high in trans fat, such as fried foods. ? Food that is high in saturated fat, such as fatty meat. ? Sweets, desserts, sugary drinks, and other foods with added sugar. ? Full-fat dairy products.  Do not salt foods before eating.  Try to eat at least 2 vegetarian meals each week.  Eat more home-cooked food and less restaurant, buffet, and fast food.  When eating at a restaurant, ask that your food be prepared with less salt or no salt, if possible. What foods are recommended? The items listed may not be a complete list. Talk with your dietitian about what dietary choices are best for you. Grains Whole-grain or whole-wheat bread. Whole-grain or whole-wheat pasta. Brown rice. [place]. Bulgur. Whole-grain and low-sodium cereals. Pita bread. Low-fat, low-sodium crackers. Whole-wheat flour tortillas. Vegetables Fresh or frozen vegetables (raw, steamed, roasted, or grilled). Low-sodium or reduced-sodium tomato and vegetable juice. Low-sodium or reduced-sodium tomato sauce and tomato paste. Low-sodium or reduced-sodium canned vegetables. Fruits All fresh, dried, or frozen fruit. Canned fruit in natural juice (without added sugar). Meat and other protein foods Skinless chicken or [place]. Ground chicken or  [place]. Pork with fat trimmed off. Fish and seafood. Egg whites. Dried beans, peas, or lentils. Unsalted nuts, nut butters, and seeds. Unsalted canned beans. Lean cuts of beef with fat trimmed off. Low-sodium, lean deli meat. Dairy Low-fat (1%) or fat-free (skim) milk. Fat-free, low-fat, or reduced-fat cheeses. Nonfat, low-sodium ricotta or cottage cheese. Low-fat or nonfat yogurt. Low-fat, low-sodium cheese. Fats and oils Soft margarine without trans fats. Vegetable oil. Low-fat, reduced-fat, or light mayonnaise and salad dressings (reduced-sodium). Canola, safflower, olive, soybean, and sunflower oils. Avocado. Seasoning and other foods Herbs. Spices. Seasoning mixes without salt. Unsalted popcorn and pretzels. Fat-free sweets. What foods are not recommended? The items listed may not be a complete list. Talk with your dietitian about what dietary choices are best for you. Grains Baked goods made with fat, such as croissants, muffins, or some breads. Dry pasta or rice meal packs. Vegetables Creamed or fried vegetables. Vegetables in a cheese sauce. Regular canned vegetables (  not low-sodium or reduced-sodium). Regular canned tomato sauce and paste (not low-sodium or reduced-sodium). Regular tomato and vegetable juice (not low-sodium or reduced-sodium). Rosita FirePickles. Olives. Fruits Canned fruit in a light or heavy syrup. Fried fruit. Fruit in cream or butter sauce. Meat and other protein foods Fatty cuts of meat. Ribs. Fried meat. Tomasa BlaseBacon. Sausage. Bologna and other processed lunch meats. Salami. Fatback. Hotdogs. Bratwurst. Salted nuts and seeds. Canned beans with added salt. Canned or smoked fish. Whole eggs or egg yolks. Chicken or [place] with skin. Dairy Whole or 2% milk, cream, and half-and-half. Whole or full-fat cream cheese. Whole-fat or sweetened yogurt. Full-fat cheese. Nondairy creamers. Whipped toppings. Processed cheese and cheese spreads. Fats and oils Butter. Stick margarine. Lard.  Shortening. Ghee. Bacon fat. Tropical oils, such as coconut, palm kernel, or palm oil. Seasoning and other foods Salted popcorn and pretzels. Onion salt, garlic salt, seasoned salt, table salt, and sea salt. Worcestershire sauce. Tartar sauce. Barbecue sauce. Teriyaki sauce. Soy sauce, including reduced-sodium. Steak sauce. Canned and packaged gravies. Fish sauce. Oyster sauce. Cocktail sauce. Horseradish that you find on the shelf. Ketchup. Mustard. Meat flavorings and tenderizers. Bouillon cubes. Hot sauce and Tabasco sauce. Premade or packaged marinades. Premade or packaged taco seasonings. Relishes. Regular salad dressings. Where to find more information:  National Heart, Lung, and Blood Institute: Pop[employer]am.iswww.nhlbi.nih.gov  American Heart Association: www.heart.org Summary  The DASH eating plan is a healthy eating plan that has been shown to reduce high blood pressure (hypertension). It may also reduce your risk for type 2 diabetes, heart disease, and stroke.  With the DASH eating plan, you should limit salt (sodium) intake to 2,300 mg a day. If you have hypertension, you may need to reduce your sodium intake to 1,500 mg a day.  When on the DASH eating plan, aim to eat more fresh fruits and vegetables, whole grains, lean proteins, low-fat dairy, and heart-healthy fats.  Work with your health care provider or diet and nutrition specialist (dietitian) to adjust your eating plan to your individual calorie needs. This information is not intended to replace advice given to you by your health care provider. Make sure you discuss any questions you have with your health care provider. Document Released: 09/13/2011 Document Revised: 09/17/2016 Document Reviewed: 09/17/2016 Elsevier Interactive Patient Education  [employer]2018 Elsevier Inc.

## 2017-10-29 NOTE — Progress Notes (Signed)
Assessment & Plan:  Phyllis Kemp was seen today for annual exam.  Diagnoses and all orders for this visit:  [place] Follow up for next exam in one year  Type 2 diabetes mellitus with complication, unspecified whether long term insulin use (HCC) -     Glucose (CBG) -     metFORMIN (GLUCOPHAGE) 500 MG tablet; Take 2 tablets (1,000 mg total) by mouth 2 (two) times daily with a meal. -     glimepiride (AMARYL) 4 MG tablet; Take 2 tablets (8 mg total) by mouth daily before breakfast.  Diabetes is poorly controlled. Advised patient to keep a fasting blood sugar log fast, 2 hours post lunch and bedtime which will be reviewed at the next office visit.  Essential hypertension -     cloNIDine (CATAPRES) tablet 0.2 mg -     amLODipine (NORVASC) 10 MG tablet; Take 1 tablet (10 mg total) by mouth daily. Continue all antihypertensives as prescribed.  Remember to bring in your blood pressure log with you for your follow up appointment.  DASH/Mediterranean Diets are healthier choices for HTN.    Patient has been counseled on age-appropriate routine health concerns for screening and prevention. These are reviewed and up-to-date. Referrals have been placed accordingly. Immunizations are up-to-date or declined.    Subjective:   Chief Complaint  Patient presents with  . Annual Exam    Patient stated she sometime have real bad back pain. Patient stated she had the cold a week ago. Patient stated she stop taking Janumet two weeks ago due to feeling nausea.   HPI Phyllis Kemp 47 y.o. female presents to office today for annual physical exam. She has stopped taking Janumet due to GI upset. She only took the medication for a few days. She is not willing to restart it at a lower dose.  She has a chronic history of noncompliance with taking her medications. She has not been taking her amaryl. Will restart her on amaryl today.  Review of Systems  Constitutional: Negative for fever, malaise/fatigue  and weight loss.  HENT: Positive for congestion. Negative for nosebleeds.   Eyes: Negative.  Negative for blurred vision, double vision and photophobia.  Respiratory: Positive for cough. Negative for shortness of breath.   Cardiovascular: Negative.  Negative for chest pain, palpitations and leg swelling.  Gastrointestinal: Positive for nausea. Negative for abdominal pain, constipation, diarrhea, heartburn and vomiting.  Musculoskeletal: Positive for back pain. Negative for joint pain, myalgias and neck pain.  Neurological: Positive for tingling (right great toe). Negative for dizziness, focal weakness, seizures and headaches.  Endo/Heme/Allergies: Negative for environmental allergies.  Psychiatric/Behavioral: Negative.  Negative for suicidal ideas.    Past Medical History:  Diagnosis Date  . Diabetes mellitus without complication ([place]) 8527  . Hypertension 2003    Past Surgical History:  Procedure Laterality Date  . ABDOMINAL HYSTERECTOMY  2006   partial for fibroid tumors, still has cervix and ovaries   . APPENDECTOMY  1985    Family History  Problem Relation Age of Onset  . Diabetes Mother   . Hypertension Mother   . Cancer Maternal Aunt        breast   . Depression Maternal Grandmother     Social History Reviewed with no changes to be made today.   Outpatient Medications Prior to Visit  Medication Sig Dispense Refill  . aspirin EC 81 MG tablet Take 81 mg daily by mouth.    Marland Kitchen atorvastatin (LIPITOR) 40 MG tablet Take  1 tablet (40 mg total) by mouth daily. 90 tablet 0  . Blood Glucose Monitoring Suppl (TRUE METRIX METER) w/Device KIT Use as instructed 1 kit 0  . glucose blood (TRUE METRIX BLOOD GLUCOSE TEST) test strip Use as instructed 100 each 12  . ibuprofen (ADVIL,MOTRIN) 800 MG tablet Take 1 tablet (800 mg total) by mouth 3 (three) times daily. 21 tablet 0  . lisinopril (PRINIVIL,ZESTRIL) 40 MG tablet Take 1 tablet (40 mg total) by mouth daily. 90 tablet 0  . Multiple  Vitamin (MULTIVITAMIN) tablet Take 1 tablet by mouth daily.    . TRUEPLUS LANCETS 28G MISC Use as instructed 100 each 3  . metFORMIN (GLUCOPHAGE) 500 MG tablet Take 1 tablet (500 mg total) 2 (two) times daily with a meal by mouth. 60 tablet 0  . amLODipine (NORVASC) 10 MG tablet Take 1 tablet (10 mg total) by mouth daily. (Patient not taking: Reported on 10/29/2017) 90 tablet 0  . cephALEXin (KEFLEX) 500 MG capsule Take 1 capsule (500 mg total) by mouth 4 (four) times daily. 20 capsule 0  . sitaGLIPtin-metformin (JANUMET) 50-1000 MG tablet Take 1 tablet by mouth 2 (two) times daily with a meal. (Patient not taking: Reported on 10/29/2017) 60 tablet 2   No facility-administered medications prior to visit.     No Known Allergies     Objective:    BP (!) 181/97 (BP Location: Left Arm, Cuff Size: Normal)   Pulse (!) 108   Temp 98.9 F (37.2 C) (Oral)   Ht 5' 7"  (1.702 m)   Wt 192 lb 12.8 oz (87.5 kg)   SpO2 96%   BMI 30.20 kg/m  Wt Readings from Last 3 Encounters:  10/29/17 192 lb 12.8 oz (87.5 kg)  09/27/17 195 lb (88.5 kg)  09/11/17 194 lb 3.2 oz (88.1 kg)    Physical Exam  Constitutional: She is oriented to person, place, and time. She appears well-developed and well-nourished.  HENT:  Head: Normocephalic and atraumatic.  Right Ear: External ear normal.  Left Ear: External ear normal.  Nose: Nose normal.  Mouth/Throat: Oropharynx is clear and moist. No oropharyngeal exudate.  Eyes: Conjunctivae and EOM are normal. Pupils are equal, round, and reactive to light. Right eye exhibits no discharge. No scleral icterus.  Neck: Normal range of motion. Neck supple. No tracheal deviation present. No thyromegaly present.  Cardiovascular: Normal rate, regular rhythm, normal heart sounds and intact distal pulses. Exam reveals no friction rub.  No murmur heard. Pulmonary/Chest: Effort normal and breath sounds normal. No accessory muscle usage. No respiratory distress. She has no decreased  breath sounds. She has no wheezes. She has no rhonchi. She has no rales. She exhibits no tenderness. Right breast exhibits no inverted nipple, no mass, no nipple discharge, no skin change and no tenderness. Left breast exhibits no inverted nipple, no mass, no nipple discharge, no skin change and no tenderness. Breasts are symmetrical.  Abdominal: Soft. Bowel sounds are normal. She exhibits no distension and no mass. There is no tenderness. There is no rebound and no guarding.  Musculoskeletal: Normal range of motion. She exhibits no edema, tenderness or deformity.  Lymphadenopathy:    She has no cervical adenopathy.  Neurological: She is alert and oriented to person, place, and time. She has normal strength and normal reflexes. She displays no atrophy and no tremor. No cranial nerve deficit or sensory deficit. She exhibits normal muscle tone. She displays a negative Romberg sign. She displays no seizure activity. Coordination and gait  normal.  Skin: Skin is warm and dry. No erythema.  Sensory exam of the foot is normal, tested with the monofilament. Good pulses,  good peripheral pulses. Skin is dry, bilateral heels are calloused. Poor foot hygiene.  Psychiatric: She has a normal mood and affect. Her speech is normal and behavior is normal. Judgment and thought content normal.      Patient has been counseled extensively about nutrition and exercise as well as the importance of adherence with medications and regular follow-up. The patient was given clear instructions to go to ER or return to medical center if symptoms don't improve, worsen or new problems develop. The patient verbalized understanding.   Follow-up: Return in about 2 months (around 12/27/2017) for HTN/HPL/DM.   Gildardo Pounds, FNP-BC [facility] [place], [place]   10/31/2017, 7:48 PM

## 2017-10-31 ENCOUNTER — Encounter: Payer: Self-pay | Admitting: Nurse Practitioner

## 2017-11-07 ENCOUNTER — Ambulatory Visit
Admission: RE | Admit: 2017-11-07 | Discharge: 2017-11-07 | Disposition: A | Discharge status: Home / Self Care | Payer: No Typology Code available for payment source | Source: Ambulatory Visit | Attending: Obstetrics and Gynecology | Admitting: Obstetrics and Gynecology

## 2017-11-07 ENCOUNTER — Encounter (HOSPITAL_COMMUNITY): Payer: Self-pay

## 2017-11-07 ENCOUNTER — Ambulatory Visit (HOSPITAL_COMMUNITY)
Admission: RE | Admit: 2017-11-07 | Discharge: 2017-11-07 | Disposition: A | Discharge status: Home / Self Care | Payer: Self-pay | Source: Ambulatory Visit | Attending: Obstetrics and Gynecology | Admitting: Obstetrics and Gynecology

## 2017-11-07 VITALS — BP 152/100 | Ht 67.0 in

## 2017-11-07 DIAGNOSIS — Z01419 Encounter for gynecological examination (general) (routine) without abnormal findings: Secondary | ICD-10-CM

## 2017-11-07 DIAGNOSIS — Z1231 Encounter for screening mammogram for malignant neoplasm of breast: Secondary | ICD-10-CM

## 2017-11-07 NOTE — Progress Notes (Signed)
No complaints today.   Pap Smear: Pap smear completed today. [place] and normal per patient. Per patient has no history of an abnormal Pap smear. Patient has a history of a Supracervical hysterectomy in 2006 due to fibroids. No Pap smear results are in Epic.  Physical exam: Breasts Breasts symmetrical. No skin abnormalities bilateral breasts. No nipple retraction bilateral breasts. No nipple discharge bilateral breasts. No lymphadenopathy. No lumps palpated bilateral breasts. No complaints of pain or tenderness on exam. [facility]. Appointment scheduled for Thursday, November 07, 2017 at 1240.  Pelvic/Bimanual   Ext Genitalia No lesions, no swelling and no discharge observed on external genitalia.         Vagina Vagina pink and normal texture. No lesions or discharge observed in vagina.          Cervix Cervix is present. Cervix pink and of normal texture. No discharge observed.     Uterus Uterus is absent due to history of Supracervical hysterectomy for benign reasons.   Adnexae Bilateral ovaries present and palpable. No tenderness on palpation.          Rectovaginal No rectal exam completed today since patient had no rectal complaints. No skin abnormalities observed on exam.    Smoking History: Patient has never smoked.  Patient Navigation: Patient education provided. [facility].

## 2017-11-07 NOTE — Patient Instructions (Signed)
Explained breast self awareness with Phyllis FleetNicola Kemp. Let patient know [facility] will cover Pap smears and HPV typing every 5 years unless has a history of abnormal Pap smears. [facility]. Appointment scheduled for Thursday, November 07, 2017 at 1240. Let patient know will follow up with her within the next couple weeks with results of Pap smear by letter or phone. Informed patient that the Breast Center will follow-up with her within the next couple of weeks with results of mammogram by letter or phone. Phyllis Fleeticola Kemp verbalized understanding.  [place], Phyllis Maserhristine Poll, RN 1:16 PM

## 2017-11-07 NOTE — Addendum Note (Signed)
Encounter addended by: Lynnell DikeHolland, Emylee Decelle H, LPN on: 2/53/66441/31/2019 1:50 PM  Actions taken: Order list changed

## 2017-11-11 LAB — CYTOLOGY - PAP
Diagnosis: UNDETERMINED — AB
HPV (WINDOPATH): NOT DETECTED

## 2017-12-06 MED FILL — LISINOPRIL 40 MG TAB: 40 | 30 days supply | Qty: 30 | Fill #2

## 2017-12-06 MED FILL — GLIMEPIRIDE 4 MG TABLET: 4 | 30 days supply | Qty: 60 | Fill #1

## 2017-12-06 MED FILL — ATORVASTATIN 40 MG TABLET: 40 | 30 days supply | Qty: 30 | Fill #1

## 2017-12-06 MED FILL — metFORMIN HCL 500 MG TABS: 500 | 30 days supply | Qty: 120 | Fill #1

## 2017-12-06 MED FILL — AMLODIPINE BESYLATE 10 MG T: 10 | 30 days supply | Qty: 30 | Fill #1

## 2017-12-24 ENCOUNTER — Ambulatory Visit: Payer: Self-pay | Admitting: Nurse Practitioner

## 2018-01-21 ENCOUNTER — Other Ambulatory Visit: Payer: Self-pay | Admitting: Nurse Practitioner

## 2018-01-21 DIAGNOSIS — I10 Essential (primary) hypertension: Secondary | ICD-10-CM

## 2018-01-21 MED FILL — ATORVASTATIN CALCIUM 40 MG: 40 | 30 days supply | Qty: 30 | Fill #2

## 2018-01-21 MED FILL — GLIMEPIRIDE 4 MG TABS: 4 | 30 days supply | Qty: 60 | Fill #2

## 2018-01-21 MED FILL — AMLODIPINE BESYLATE 10 MG T: 10 | 30 days supply | Qty: 30 | Fill #2

## 2018-01-21 MED FILL — metFORMIN HCL 500 MG TABS: 500 | 30 days supply | Qty: 120 | Fill #2

## 2018-01-21 MED FILL — LISINOPRIL 40 MG TABLET: 40 | 30 days supply | Qty: 30 | Fill #0

## 2018-04-09 ENCOUNTER — Encounter (HOSPITAL_COMMUNITY): Payer: Self-pay

## 2018-04-09 ENCOUNTER — Other Ambulatory Visit: Payer: Self-pay

## 2018-04-09 ENCOUNTER — Emergency Department (HOSPITAL_COMMUNITY)
Admission: EM | Admit: 2018-04-09 | Discharge: 2018-04-09 | Disposition: A | Discharge status: Home / Self Care | Payer: No Typology Code available for payment source | Attending: Emergency Medicine | Admitting: Emergency Medicine

## 2018-04-09 DIAGNOSIS — E785 Hyperlipidemia, unspecified: Secondary | ICD-10-CM | POA: Insufficient documentation

## 2018-04-09 DIAGNOSIS — I10 Essential (primary) hypertension: Secondary | ICD-10-CM

## 2018-04-09 DIAGNOSIS — E1169 Type 2 diabetes mellitus with other specified complication: Secondary | ICD-10-CM

## 2018-04-09 DIAGNOSIS — R739 Hyperglycemia, unspecified: Secondary | ICD-10-CM

## 2018-04-09 DIAGNOSIS — Z7982 Long term (current) use of aspirin: Secondary | ICD-10-CM | POA: Insufficient documentation

## 2018-04-09 DIAGNOSIS — Z79899 Other long term (current) drug therapy: Secondary | ICD-10-CM | POA: Insufficient documentation

## 2018-04-09 DIAGNOSIS — E118 Type 2 diabetes mellitus with unspecified complications: Secondary | ICD-10-CM

## 2018-04-09 LAB — CBC WITH DIFFERENTIAL/PLATELET
ABS IMMATURE GRANULOCYTES: 0 10*3/uL (ref 0.0–0.1)
BASOS ABS: 0 10*3/uL (ref 0.0–0.1)
Basophils Relative: 1 %
Eosinophils Absolute: 0.1 10*3/uL (ref 0.0–0.7)
Eosinophils Relative: 3 %
HCT: 39.3 % (ref 36.0–46.0)
HEMOGLOBIN: 12.5 g/dL (ref 12.0–15.0)
Immature Granulocytes: 0 %
LYMPHS PCT: 32 %
Lymphs Abs: 1.5 10*3/uL (ref 0.7–4.0)
MCH: 27.8 pg (ref 26.0–34.0)
MCHC: 31.8 g/dL (ref 30.0–36.0)
MCV: 87.5 fL (ref 78.0–100.0)
MONO ABS: 0.3 10*3/uL (ref 0.1–1.0)
Monocytes Relative: 6 %
NEUTROS ABS: 2.6 10*3/uL (ref 1.7–7.7)
Neutrophils Relative %: 58 %
Platelets: 206 10*3/uL (ref 150–400)
RBC: 4.49 MIL/uL (ref 3.87–5.11)
RDW: 12.2 % (ref 11.5–15.5)
WBC: 4.5 10*3/uL (ref 4.0–10.5)

## 2018-04-09 LAB — I-STAT CHEM 8, ED
BUN: 13 mg/dL (ref 6–20)
CHLORIDE: 99 mmol/L (ref 98–111)
Calcium, Ion: 1.11 mmol/L — ABNORMAL LOW (ref 1.15–1.40)
Creatinine, Ser: 0.4 mg/dL — ABNORMAL LOW (ref 0.44–1.00)
Glucose, Bld: 210 mg/dL — ABNORMAL HIGH (ref 70–99)
HEMATOCRIT: 40 % (ref 36.0–46.0)
Hemoglobin: 13.6 g/dL (ref 12.0–15.0)
POTASSIUM: 4.1 mmol/L (ref 3.5–5.1)
SODIUM: 137 mmol/L (ref 135–145)
TCO2: 28 mmol/L (ref 22–32)

## 2018-04-09 MED ORDER — ATORVASTATIN CALCIUM 40 MG PO TABS: 40.0000 mg | ORAL_TABLET | Freq: Every day | ORAL | 0 refills | Status: DC

## 2018-04-09 MED ORDER — LISINOPRIL 40 MG PO TABS: 40.0000 mg | ORAL_TABLET | Freq: Every day | ORAL | 0 refills | Status: DC

## 2018-04-09 MED ORDER — LISINOPRIL 20 MG PO TABS
40.0000 mg | ORAL_TABLET | Freq: Every day | ORAL | Status: DC
  Administered 2018-04-09: 40 mg via ORAL
  Filled 2018-04-09: qty 2

## 2018-04-09 MED ORDER — AMLODIPINE BESYLATE 10 MG PO TABS: 10.0000 mg | ORAL_TABLET | Freq: Every day | ORAL | 0 refills | Status: DC

## 2018-04-09 MED ORDER — GLIMEPIRIDE 4 MG PO TABS
8.0000 mg | ORAL_TABLET | Freq: Every day | ORAL | Status: DC
  Administered 2018-04-09: 8 mg via ORAL
  Filled 2018-04-09 (×2): qty 2

## 2018-04-09 MED ORDER — METFORMIN HCL 500 MG PO TABS: 1000.0000 mg | ORAL_TABLET | Freq: Two times a day (BID) | ORAL | 3 refills | Status: DC

## 2018-04-09 MED ORDER — ACETAMINOPHEN 325 MG PO TABS
650.0000 mg | ORAL_TABLET | Freq: Once | ORAL | Status: AC
  Administered 2018-04-09: 650 mg via ORAL
  Filled 2018-04-09: qty 2

## 2018-04-09 MED ORDER — GLIMEPIRIDE 4 MG PO TABS: 8.0000 mg | ORAL_TABLET | Freq: Every day | ORAL | 3 refills | Status: DC

## 2018-04-09 MED ORDER — AMLODIPINE BESYLATE 5 MG PO TABS
10.0000 mg | ORAL_TABLET | Freq: Every day | ORAL | Status: DC
  Administered 2018-04-09: 10 mg via ORAL
  Filled 2018-04-09: qty 2

## 2018-04-09 NOTE — ED Triage Notes (Signed)
patient complains of headache x 2 days, states she has been out of her BP meds x 5 days. Denies trauma, no associated symptoms. Alert and oriented

## 2018-04-09 NOTE — ED Provider Notes (Signed)
[place] EMERGENCY DEPARTMENT Provider Note   CSN: [id number] Arrival date & time: 04/09/18  4782     History   Chief Complaint No chief complaint on file.   HPI Phyllis Kemp is a 47 y.o. female.  Patient is a 47 year old female with a history of hypertension and diabetes presenting today with a complaint of headache for the last 2 to 3 days that is been intermittent.  Patient states that one week ago she ran out of her blood pressure medications because she missed an appointment and she has been trying to call the office but has not been able to get a hold of anybody.  She states the headache is a dull throbbing sensation at the top of her head that comes and goes.  She occasionally will feel dizzy but denies any vision changes, unilateral numbness, weakness or speech changes.  She denies any nausea, vomiting or abdominal pain.  She is also run out of 1 of her diabetic medications but is still taking metformin.  Over the last few days she is also had polyuria and polydipsia.  She states in the past when she has not had her blood pressure medication she has had similar symptoms.  The history is provided by the patient.    Past Medical History:  Diagnosis Date  . Diabetes mellitus without complication ([place]) 9562  . Hypertension 2003    Patient Active Problem List   Diagnosis Date Noted  . Hyperlipidemia associated with type 2 diabetes mellitus ([place]) 10/19/2014  . DM2 (diabetes mellitus, type 2) ([place]) 10/18/2014  . Essential hypertension 10/18/2014  . Left foot pain 10/18/2014  . ETOH abuse 10/18/2014    Past Surgical History:  Procedure Laterality Date  . ABDOMINAL HYSTERECTOMY  2006   partial for fibroid tumors, still has cervix and ovaries   . APPENDECTOMY  1985     OB History    Gravida  2   Para      Term      Preterm      AB      Living  2     SAB      TAB      Ectopic      Multiple      Live Births  2            Home  Medications    Prior to Admission medications   Medication Sig Start Date End Date Taking? Authorizing Provider  amLODipine (NORVASC) 10 MG tablet Take 1 tablet (10 mg total) by mouth daily. 10/29/17 01/27/18  Gildardo Pounds, NP  aspirin EC 81 MG tablet Take 81 mg daily by mouth.    [provider]  atorvastatin (LIPITOR) 40 MG tablet Take 1 tablet (40 mg total) by mouth daily. 09/24/17 12/23/17  Gildardo Pounds, NP  Blood Glucose Monitoring Suppl (TRUE METRIX METER) w/Device KIT Use as instructed Patient not taking: Reported on 11/07/2017 09/11/17   Gildardo Pounds, NP  glimepiride (AMARYL) 4 MG tablet Take 2 tablets (8 mg total) by mouth daily before breakfast. 10/29/17   Gildardo Pounds, NP  glucose blood (TRUE METRIX BLOOD GLUCOSE TEST) test strip Use as instructed 09/11/17   Gildardo Pounds, NP  ibuprofen (ADVIL,MOTRIN) 800 MG tablet Take 1 tablet (800 mg total) by mouth 3 (three) times daily. Patient not taking: Reported on 11/07/2017 09/27/17   Pisciotta, Elmyra Ricks, PA-C  lisinopril (PRINIVIL,ZESTRIL) 40 MG tablet TAKE 1 TABLET BY MOUTH DAILY. 01/21/18  Gildardo Pounds, NP  metFORMIN (GLUCOPHAGE) 500 MG tablet Take 2 tablets (1,000 mg total) by mouth 2 (two) times daily with a meal. 10/29/17 01/27/18  Gildardo Pounds, NP  Multiple Vitamin (MULTIVITAMIN) tablet Take 1 tablet by mouth daily.    [provider]  TRUEPLUS LANCETS 28G MISC Use as instructed 09/11/17   Gildardo Pounds, NP    Family History Family History  Problem Relation Age of Onset  . Diabetes Mother   . Hypertension Mother   . Cancer Maternal Aunt        breast   . Depression Maternal Grandmother     Social History Social History   Tobacco Use  . Smoking status: Never Smoker  . Smokeless tobacco: Never Used  Substance Use Topics  . Alcohol use: Yes    Alcohol/week: 0.0 oz    Comment: socially  . Drug use: No     Allergies   Patient has no known allergies.   Review of Systems Review of  Systems  Respiratory: Negative for cough, choking and wheezing.        Occasional shortness of breath 1-2 times a week that will occur sometimes when she is cooking.  She does not typically get it with exertion.  No chest pain.  All other systems reviewed and are negative.    Physical Exam Updated Vital Signs BP (!) 163/96   Pulse 94   Temp 98.3 F (36.8 C) (Oral)   Resp 16   SpO2 99%   Physical Exam  Constitutional: She is oriented to person, place, and time. She appears well-developed and well-nourished. No distress.  HENT:  Head: Normocephalic and atraumatic.  Mouth/Throat: Oropharynx is clear and moist.  Eyes: Pupils are equal, round, and reactive to light. Conjunctivae and EOM are normal. Right eye exhibits no discharge. Left eye exhibits no discharge.  No photophobia  Neck: Normal range of motion. Neck supple. No spinous process tenderness present. No neck rigidity. No Brudzinski's sign and no Kernig's sign noted.  Cardiovascular: Normal rate, regular rhythm, normal heart sounds and intact distal pulses.  No murmur heard. Pulmonary/Chest: Effort normal and breath sounds normal. No respiratory distress. She has no wheezes. She has no rales.  Abdominal: Soft. She exhibits no distension. There is no tenderness. There is no rebound and no guarding.  Musculoskeletal: Normal range of motion. She exhibits no edema or tenderness.  Lymphadenopathy:    She has no cervical adenopathy.  Neurological: She is alert and oriented to person, place, and time. She has normal strength. No cranial nerve deficit or sensory deficit. Coordination and gait normal. GCS eye subscore is 4. GCS verbal subscore is 5. GCS motor subscore is 6.  Skin: Skin is warm and dry. No rash noted. No erythema.  Scars over the lower legs without signs of active wounds  Psychiatric: She has a normal mood and affect. Her behavior is normal.  Nursing note and vitals reviewed.    ED Treatments / Results  Labs (all  labs ordered are listed, but only abnormal results are displayed) Labs Reviewed  I-STAT CHEM 8, ED - Abnormal; Notable for the following components:      Result Value   Creatinine, Ser 0.40 (*)    Glucose, Bld 210 (*)    Calcium, Ion 1.11 (*)    All other components within normal limits  CBC WITH DIFFERENTIAL/PLATELET    EKG None  Radiology No results found.  Procedures Procedures (including critical care time)  Medications Ordered  in ED Medications  amLODipine (NORVASC) tablet 10 mg (has no administration in time range)  glimepiride (AMARYL) tablet 8 mg (has no administration in time range)  lisinopril (PRINIVIL,ZESTRIL) tablet 40 mg (has no administration in time range)  acetaminophen (TYLENOL) tablet 650 mg (has no administration in time range)     Initial Impression / Assessment and Plan / ED Course  I have reviewed the triage vital signs and the nursing notes.  Pertinent labs & imaging results that were available during my care of the patient were reviewed by me and considered in my medical decision making (see chart for details).     Patient presenting today with nonspecific headache without concerning features for subarachnoid hemorrhage, infectious etiology, space-occupying lesion or stroke.  Feel most likely related to going without her blood pressure medications.  Also patient has been without 1 of her diabetic meds and she is having polyuria and polydipsia.  Will ensure that patient's blood sugar is not exceedingly elevated.  Patient given a dose of her home medications.  CBC and i-STAT Chem-8 pending.  10:02 AM Labs without acute findings.  Will d/c pt with her med prescriptions.    Final Clinical Impressions(s) / ED Diagnoses   Final diagnoses:  Hypertension, unspecified type  Hyperglycemia    ED Discharge Orders        Ordered    amLODipine (NORVASC) 10 MG tablet  Daily     04/09/18 1004    atorvastatin (LIPITOR) 40 MG tablet  Daily     04/09/18  1004    glimepiride (AMARYL) 4 MG tablet  Daily before breakfast     04/09/18 1004    lisinopril (PRINIVIL,ZESTRIL) 40 MG tablet  Daily    Note to Pharmacy:  Must have office visit for refills   04/09/18 1004    metFORMIN (GLUCOPHAGE) 500 MG tablet  2 times daily with meals     04/09/18 1004       Blanchie Dessert, MD 04/09/18 1005

## 2018-11-17 ENCOUNTER — Encounter (HOSPITAL_COMMUNITY): Payer: Self-pay

## 2018-11-17 ENCOUNTER — Emergency Department (HOSPITAL_COMMUNITY)
Admission: EM | Admit: 2018-11-17 | Discharge: 2018-11-17 | Disposition: A | Discharge status: Home / Self Care | Payer: Self-pay | Attending: Emergency Medicine | Admitting: Emergency Medicine

## 2018-11-17 DIAGNOSIS — I10 Essential (primary) hypertension: Secondary | ICD-10-CM | POA: Insufficient documentation

## 2018-11-17 DIAGNOSIS — E119 Type 2 diabetes mellitus without complications: Secondary | ICD-10-CM | POA: Insufficient documentation

## 2018-11-17 DIAGNOSIS — Z79899 Other long term (current) drug therapy: Secondary | ICD-10-CM | POA: Insufficient documentation

## 2018-11-17 DIAGNOSIS — R21 Rash and other nonspecific skin eruption: Secondary | ICD-10-CM | POA: Insufficient documentation

## 2018-11-17 DIAGNOSIS — Z7984 Long term (current) use of oral hypoglycemic drugs: Secondary | ICD-10-CM | POA: Insufficient documentation

## 2018-11-17 DIAGNOSIS — E1169 Type 2 diabetes mellitus with other specified complication: Secondary | ICD-10-CM

## 2018-11-17 DIAGNOSIS — E785 Hyperlipidemia, unspecified: Secondary | ICD-10-CM

## 2018-11-17 DIAGNOSIS — E118 Type 2 diabetes mellitus with unspecified complications: Secondary | ICD-10-CM

## 2018-11-17 DIAGNOSIS — Z7982 Long term (current) use of aspirin: Secondary | ICD-10-CM | POA: Insufficient documentation

## 2018-11-17 MED ORDER — AMLODIPINE BESYLATE 10 MG PO TABS: 10.0000 mg | ORAL_TABLET | Freq: Every day | ORAL | 0 refills | Status: DC

## 2018-11-17 MED ORDER — ATORVASTATIN CALCIUM 40 MG PO TABS: 40.0000 mg | ORAL_TABLET | Freq: Every day | ORAL | 0 refills | Status: DC

## 2018-11-17 MED ORDER — LISINOPRIL 40 MG PO TABS: 40.0000 mg | ORAL_TABLET | Freq: Every day | ORAL | 0 refills | Status: DC

## 2018-11-17 MED ORDER — HYDROCORTISONE 1 % EX CREA: TOPICAL_CREAM | CUTANEOUS | 0 refills | Status: DC

## 2018-11-17 MED ORDER — METFORMIN HCL 500 MG PO TABS: 1000.0000 mg | ORAL_TABLET | Freq: Two times a day (BID) | ORAL | 0 refills | Status: DC

## 2018-11-17 MED ORDER — GLIMEPIRIDE 4 MG PO TABS: 8.0000 mg | ORAL_TABLET | Freq: Every day | ORAL | 0 refills | Status: DC

## 2018-11-17 NOTE — ED Provider Notes (Signed)
[facility] EMERGENCY DEPARTMENT Provider Note   CSN: [id number] Arrival date & time: 11/17/18  7342     History   Chief Complaint Chief Complaint  Patient presents with  . Back Pain  . Rash    HPI Phyllis Kemp is a 48 y.o. female with past medical history of type 2 diabetes, hypertension, presenting to the emergency department with complaint of rash on her right upper back that started a couple of months ago.  Patient states initially it was itchy and burning, with some associated redness.  She states it has been gradually improving, however there is a lingering itchiness to the rash.  She states she feels somewhat achy to her back in that area as well.  No interventions tried.  No new detergents, soaps, personal products.  No swelling of lips or tongue.  Patient states she has not seen her PCP in about a year, has been out of her blood pressure medications as well as diabetes medications for multiple weeks.  No symptoms of anaphylaxis.  No other symptoms reported.  The history is provided by the patient.    Past Medical History:  Diagnosis Date  . Diabetes mellitus without complication ([place]) 8768  . Hypertension 2003    Patient Active Problem List   Diagnosis Date Noted  . Hyperlipidemia associated with type 2 diabetes mellitus ([place]) 10/19/2014  . DM2 (diabetes mellitus, type 2) ([place]) 10/18/2014  . Essential hypertension 10/18/2014  . Left foot pain 10/18/2014  . ETOH abuse 10/18/2014    Past Surgical History:  Procedure Laterality Date  . ABDOMINAL HYSTERECTOMY  2006   partial for fibroid tumors, still has cervix and ovaries   . APPENDECTOMY  1985     OB History    Gravida  2   Para      Term      Preterm      AB      Living  2     SAB      TAB      Ectopic      Multiple      Live Births  2            Home Medications    Prior to Admission medications   Medication Sig Start Date End Date Taking? Authorizing Provider    amLODipine (NORVASC) 10 MG tablet Take 1 tablet (10 mg total) by mouth daily for 30 days. 11/17/18 12/17/18  , [place] N, PA-C  aspirin EC 81 MG tablet Take 81 mg daily by mouth.    [provider]  atorvastatin (LIPITOR) 40 MG tablet Take 1 tablet (40 mg total) by mouth daily for 30 days. 11/17/18 12/17/18  , [place] N, PA-C  Blood Glucose Monitoring Suppl (TRUE METRIX METER) w/Device KIT Use as instructed Patient not taking: Reported on 11/07/2017 09/11/17   Gildardo Pounds, NP  glimepiride (AMARYL) 4 MG tablet Take 2 tablets (8 mg total) by mouth daily before breakfast for 30 days. 11/17/18 12/17/18  , [place] N, PA-C  glucose blood (TRUE METRIX BLOOD GLUCOSE TEST) test strip Use as instructed 09/11/17   Gildardo Pounds, NP  hydrocortisone cream 1 % Apply to affected area 2 times daily for 1 week 11/17/18   , [place] N, PA-C  ibuprofen (ADVIL,MOTRIN) 800 MG tablet Take 1 tablet (800 mg total) by mouth 3 (three) times daily. Patient not taking: Reported on 11/07/2017 09/27/17   Pisciotta, Elmyra Ricks, PA-C  lisinopril (PRINIVIL,ZESTRIL) 40 MG tablet Take  1 tablet (40 mg total) by mouth daily. 11/17/18   , [place] N, PA-C  metFORMIN (GLUCOPHAGE) 500 MG tablet Take 2 tablets (1,000 mg total) by mouth 2 (two) times daily with a meal for 30 days. 11/17/18 12/17/18  , [place] N, PA-C  Multiple Vitamin (MULTIVITAMIN) tablet Take 1 tablet by mouth daily.    [provider]  TRUEPLUS LANCETS 28G MISC Use as instructed 09/11/17   Gildardo Pounds, NP    Family History Family History  Problem Relation Age of Onset  . Diabetes Mother   . Hypertension Mother   . Cancer Maternal Aunt        breast   . Depression Maternal Grandmother     Social History Social History   Tobacco Use  . Smoking status: Never Smoker  . Smokeless tobacco: Never Used  Substance Use Topics  . Alcohol use: Yes    Alcohol/week: 0.0 standard drinks    Comment: socially  .  Drug use: No     Allergies   Patient has no known allergies.   Review of Systems Review of Systems  Musculoskeletal: Positive for myalgias.  Skin: Positive for rash.     Physical Exam Updated Vital Signs BP (!) 190/111 (BP Location: Right Arm)   Pulse 85   Temp 98.4 F (36.9 C) (Oral)   Resp 18   SpO2 97%   Physical Exam Vitals signs and nursing note reviewed.  Constitutional:      General: She is not in acute distress.    Appearance: She is well-developed. She is not toxic-appearing.  HENT:     Head: Normocephalic and atraumatic.  Eyes:     Conjunctiva/sclera: Conjunctivae normal.  Cardiovascular:     Rate and Rhythm: Normal rate and regular rhythm.  Pulmonary:     Effort: Pulmonary effort is normal.     Breath sounds: Normal breath sounds.  Skin:    Comments: There is a faint hyperpigmented dry-appearing rash to the  thoracic back, just distal to the scapula, to the right of the midline.  No vesicles, erythema, warmth, purulence, desquamation, petechia.  Nontender.  Neurological:     Mental Status: She is alert.  Psychiatric:        Mood and Affect: Mood normal.        Behavior: Behavior normal.      ED Treatments / Results  Labs (all labs ordered are listed, but only abnormal results are displayed) Labs Reviewed - No data to display  EKG None  Radiology No results found.  Procedures Procedures (including critical care time)  Medications Ordered in ED Medications - No data to display   Initial Impression / Assessment and Plan / ED Course  I have reviewed the triage vital signs and the nursing notes.  Pertinent labs & imaging results that were available during my care of the patient were reviewed by me and considered in my medical decision making (see chart for details).     Patient with a itchy rash to her right back, for multiple months.  No symptoms of anaphylaxis.  On exam, rash does not appear to be bacterial or fungal.  Skin is dry and  slightly hyperpigmented.  Question if rash was initially shingles.  Also consider dermatitis.  Will treat with topical hydrocortisone cream and instructed PCP follow-up.  We will also prescribe refills for home medications, and strongly urged she follow-up with her PCP for regular medication management.  Hypertensive in the ED today, however does not  report any signs or symptoms of endorgan damage.  Safe For discharge.  Discussed results, findings, treatment and follow up. Patient advised of return precautions. Patient verbalized understanding and agreed with plan.  Final Clinical Impressions(s) / ED Diagnoses   Final diagnoses:  Rash  Hypertension, unspecified type  Type 2 diabetes mellitus without complication, without long-term current use of insulin [facility])    ED Discharge Orders         Ordered    amLODipine (NORVASC) 10 MG tablet  Daily     11/17/18 1018    atorvastatin (LIPITOR) 40 MG tablet  Daily     11/17/18 1018    lisinopril (PRINIVIL,ZESTRIL) 40 MG tablet  Daily    Note to Pharmacy:  Must have office visit for refills   11/17/18 1018    glimepiride (AMARYL) 4 MG tablet  Daily before breakfast     11/17/18 1018    metFORMIN (GLUCOPHAGE) 500 MG tablet  2 times daily with meals     11/17/18 1018    hydrocortisone cream 1 %     11/17/18 [place], [place] N, [place] 11/17/18 1125    Hayden Rasmussen, MD 11/17/18 [phone]

## 2018-11-17 NOTE — Discharge Instructions (Addendum)
Apply a thin layer of cream to your rash, 2 times daily for 1 week.  Schedule an appointment with your primary care provider to follow-up on your visit today, and for further management of your blood pressure and diabetes medications.

## 2018-11-17 NOTE — ED Triage Notes (Signed)
Patient here with itchy rash around lower right scapula and also complains pain to right thoracic area for 1 month, pain worse when she lays on back, No trauma. Dry skin noted, no obvious rash

## 2019-03-05 ENCOUNTER — Other Ambulatory Visit: Payer: Self-pay

## 2019-03-05 ENCOUNTER — Encounter (HOSPITAL_COMMUNITY): Payer: Self-pay | Admitting: Emergency Medicine

## 2019-03-05 ENCOUNTER — Emergency Department (HOSPITAL_COMMUNITY)
Admission: EM | Admit: 2019-03-05 | Discharge: 2019-03-05 | Disposition: A | Discharge status: Home / Self Care | Payer: Self-pay | Attending: Emergency Medicine | Admitting: Emergency Medicine

## 2019-03-05 ENCOUNTER — Emergency Department (HOSPITAL_COMMUNITY): Payer: Self-pay

## 2019-03-05 DIAGNOSIS — Z7982 Long term (current) use of aspirin: Secondary | ICD-10-CM | POA: Insufficient documentation

## 2019-03-05 DIAGNOSIS — R739 Hyperglycemia, unspecified: Secondary | ICD-10-CM

## 2019-03-05 DIAGNOSIS — R51 Headache: Secondary | ICD-10-CM | POA: Insufficient documentation

## 2019-03-05 DIAGNOSIS — E118 Type 2 diabetes mellitus with unspecified complications: Secondary | ICD-10-CM

## 2019-03-05 DIAGNOSIS — E1165 Type 2 diabetes mellitus with hyperglycemia: Secondary | ICD-10-CM | POA: Insufficient documentation

## 2019-03-05 DIAGNOSIS — I160 Hypertensive urgency: Secondary | ICD-10-CM | POA: Insufficient documentation

## 2019-03-05 DIAGNOSIS — I10 Essential (primary) hypertension: Secondary | ICD-10-CM

## 2019-03-05 DIAGNOSIS — Z79899 Other long term (current) drug therapy: Secondary | ICD-10-CM | POA: Insufficient documentation

## 2019-03-05 DIAGNOSIS — Z7984 Long term (current) use of oral hypoglycemic drugs: Secondary | ICD-10-CM | POA: Insufficient documentation

## 2019-03-05 DIAGNOSIS — R000 Tachycardia, unspecified: Secondary | ICD-10-CM | POA: Insufficient documentation

## 2019-03-05 LAB — BASIC METABOLIC PANEL
Anion gap: 11 (ref 5–15)
BUN: 13 mg/dL (ref 6–20)
CO2: 27 mmol/L (ref 22–32)
Calcium: 9.5 mg/dL (ref 8.9–10.3)
Chloride: 98 mmol/L (ref 98–111)
Creatinine, Ser: 0.65 mg/dL (ref 0.44–1.00)
GFR calc Af Amer: >60 mL/min (ref >60)
GFR calc non Af Amer: >60 mL/min (ref >60)
Glucose, Bld: 417 mg/dL — ABNORMAL HIGH (ref 70–99)
Potassium: 4.4 mmol/L (ref 3.5–5.1)
Sodium: 136 mmol/L (ref 135–145)

## 2019-03-05 LAB — CBC
HCT: 40.9 % (ref 36.0–46.0)
Hemoglobin: 13.2 g/dL (ref 12.0–15.0)
MCH: 27.7 pg (ref 26.0–34.0)
MCHC: 32.3 g/dL (ref 30.0–36.0)
MCV: 85.7 fL (ref 80.0–100.0)
Platelets: 227 10*3/uL (ref 150–400)
RBC: 4.77 MIL/uL (ref 3.87–5.11)
RDW: 11.6 % (ref 11.5–15.5)
WBC: 5.6 10*3/uL (ref 4.0–10.5)
nRBC: 0 % (ref 0.0–0.2)

## 2019-03-05 LAB — D-DIMER, QUANTITATIVE: D-Dimer, Quant: <0.27 ug/mL-FEU (ref 0.00–0.50)

## 2019-03-05 LAB — TROPONIN I: Troponin I: <0.03 ng/mL (ref <0.03)

## 2019-03-05 MED ORDER — LISINOPRIL 40 MG PO TABS: 40.0000 mg | ORAL_TABLET | Freq: Every day | ORAL | 0 refills | Status: DC

## 2019-03-05 MED ORDER — SODIUM CHLORIDE 0.9 % IV BOLUS
1000.0000 mL | Freq: Once | INTRAVENOUS | Status: AC
  Administered 2019-03-05: 10:00:00 1000 mL via INTRAVENOUS

## 2019-03-05 MED ORDER — METFORMIN HCL 500 MG PO TABS: 1000.0000 mg | ORAL_TABLET | Freq: Two times a day (BID) | ORAL | 0 refills | Status: DC

## 2019-03-05 MED ORDER — AMLODIPINE BESYLATE 10 MG PO TABS: 10.0000 mg | ORAL_TABLET | Freq: Every day | ORAL | 0 refills | Status: DC

## 2019-03-05 MED ORDER — TRUE METRIX METER W/DEVICE KIT: PACK | 0 refills | Status: DC

## 2019-03-05 MED ORDER — AMLODIPINE BESYLATE 5 MG PO TABS
10.0000 mg | ORAL_TABLET | Freq: Once | ORAL | Status: AC
  Administered 2019-03-05: 10:00:00 10 mg via ORAL
  Filled 2019-03-05: qty 2

## 2019-03-05 NOTE — ED Triage Notes (Signed)
Pt in with c/o dizziness, HA and HTN. Has been out of meds and not taken BP meds x 1 wk.

## 2019-03-05 NOTE — Discharge Instructions (Signed)
Please take your blood pressure medications and diabetic medications and follow up closely with your doctor for further care.

## 2019-03-05 NOTE — ED Provider Notes (Signed)
[facility] EMERGENCY DEPARTMENT Provider Note   CSN: [id number] Arrival date & time: 03/05/19  0913    History   Chief Complaint Chief Complaint  Patient presents with  . Dizziness  . Hypertension    HPI Phyllis Kemp is a 48 y.o. female.     The history is provided by the patient. No language interpreter was used.     48 year old female with history of hypertension, diabetes presenting complaining of dizziness.  Patient report her doctor left and therefore she has not follow-up with her primary care doctor recently.  Due to that, she ran out of her blood pressure medication approximately 2 weeks ago.  For the past 4 days she experiencing some mild heart palpitation, mild shortness of breath, and not feeling well.  She does not complain of any fever or chills, room spinning sensation, exertional chest pain, productive cough, hemoptysis, back pain abdominal pain focal numbness or weakness or rash.  She attributed her symptoms due to lack of blood pressure medication.  She denies any prior history of PE or DVT, no recent surgery, prolonged bedrest, active cancer, leg swelling or calf pain.  She is not on any kind of oral hormone.  Past Medical History:  Diagnosis Date  . Diabetes mellitus without complication ([place]) 2831  . Hypertension 2003    Patient Active Problem List   Diagnosis Date Noted  . Hyperlipidemia associated with type 2 diabetes mellitus ([place]) 10/19/2014  . DM2 (diabetes mellitus, type 2) ([place]) 10/18/2014  . Essential hypertension 10/18/2014  . Left foot pain 10/18/2014  . ETOH abuse 10/18/2014    Past Surgical History:  Procedure Laterality Date  . ABDOMINAL HYSTERECTOMY  2006   partial for fibroid tumors, still has cervix and ovaries   . APPENDECTOMY  1985     OB History    Gravida  2   Para      Term      Preterm      AB      Living  2     SAB      TAB      Ectopic      Multiple      Live Births  2            Home Medications    Prior to Admission medications   Medication Sig Start Date End Date Taking? Authorizing Provider  amLODipine (NORVASC) 10 MG tablet Take 1 tablet (10 mg total) by mouth daily for 30 days. 11/17/18 12/17/18  Robinson, [place] N, PA-C  aspirin EC 81 MG tablet Take 81 mg daily by mouth.    [provider]  atorvastatin (LIPITOR) 40 MG tablet Take 1 tablet (40 mg total) by mouth daily for 30 days. 11/17/18 12/17/18  Robinson, [place] N, PA-C  Blood Glucose Monitoring Suppl (TRUE METRIX METER) w/Device KIT Use as instructed Patient not taking: Reported on 11/07/2017 09/11/17   Gildardo Pounds, NP  glimepiride (AMARYL) 4 MG tablet Take 2 tablets (8 mg total) by mouth daily before breakfast for 30 days. 11/17/18 12/17/18  Robinson, [place] N, PA-C  glucose blood (TRUE METRIX BLOOD GLUCOSE TEST) test strip Use as instructed 09/11/17   Gildardo Pounds, NP  hydrocortisone cream 1 % Apply to affected area 2 times daily for 1 week 11/17/18   Robinson, [place] N, PA-C  ibuprofen (ADVIL,MOTRIN) 800 MG tablet Take 1 tablet (800 mg total) by mouth 3 (three) times daily. Patient not taking: Reported on 11/07/2017 09/27/17  Pisciotta, Elmyra Ricks, PA-C  lisinopril (PRINIVIL,ZESTRIL) 40 MG tablet Take 1 tablet (40 mg total) by mouth daily. 11/17/18   Robinson, [place] N, PA-C  metFORMIN (GLUCOPHAGE) 500 MG tablet Take 2 tablets (1,000 mg total) by mouth 2 (two) times daily with a meal for 30 days. 11/17/18 12/17/18  Robinson, [place] N, PA-C  Multiple Vitamin (MULTIVITAMIN) tablet Take 1 tablet by mouth daily.    [provider]  TRUEPLUS LANCETS 28G MISC Use as instructed 09/11/17   Gildardo Pounds, NP    Family History Family History  Problem Relation Age of Onset  . Diabetes Mother   . Hypertension Mother   . Cancer Maternal Aunt        breast   . Depression Maternal Grandmother     Social History Social History   Tobacco Use  . Smoking status: Never Smoker  . Smokeless tobacco:  Never Used  Substance Use Topics  . Alcohol use: Yes    Alcohol/week: 0.0 standard drinks    Comment: socially  . Drug use: No     Allergies   Patient has no known allergies.   Review of Systems Review of Systems  All other systems reviewed and are negative.    Physical Exam Updated Vital Signs BP (!) 197/112 (BP Location: Right Arm)   Pulse (!) 103   Temp 98.2 F (36.8 C) (Oral)   Resp 15   Ht _0  (1.702 m)   Wt 90.3 kg   SpO2 100%   BMI 31.17 kg/m   Physical Exam Vitals signs and nursing note reviewed.  Constitutional:      General: She is not in acute distress.    Appearance: She is well-developed.  HENT:     Head: Atraumatic.  Eyes:     Conjunctiva/sclera: Conjunctivae normal.  Neck:     Musculoskeletal: Neck supple.  Cardiovascular:     Rate and Rhythm: Tachycardia present.     Pulses: Normal pulses.     Heart sounds: Normal heart sounds.  Pulmonary:     Effort: Pulmonary effort is normal. No respiratory distress.     Breath sounds: Normal breath sounds. No stridor. No wheezing.  Abdominal:     Palpations: Abdomen is soft.  Skin:    Findings: No rash.  Neurological:     Mental Status: She is alert and oriented to person, place, and time.  Psychiatric:        Mood and Affect: Mood normal.      ED Treatments / Results  Labs (all labs ordered are listed, but only abnormal results are displayed) Labs Reviewed  BASIC METABOLIC PANEL - Abnormal; Notable for the following components:      Result Value   Glucose, Bld 417 (*)    All other components within normal limits  CBC  TROPONIN I  D-DIMER, QUANTITATIVE (NOT AT [facility])    EKG EKG Interpretation  Date/Time:  Thursday Mar 05 2019 09:29:27 EDT Ventricular Rate:  103 PR Interval:    QRS Duration: 83 QT Interval:  353 QTC Calculation: 463 R Axis:   -37 Text Interpretation:  Sinus tachycardia Left axis deviation Abnormal R-wave progression, early transition Confirmed by Veryl Speak  [phone]) on 03/05/2019 9:59:08 AM   Radiology Dg Chest Port 1 View  Result Date: 03/05/2019 CLINICAL DATA:  Dizziness. EXAM: PORTABLE CHEST 1 VIEW COMPARISON:  Radiographs of October 05, 2015. FINDINGS: Stable cardiomediastinal silhouette. No pneumothorax or pleural effusion is noted. Both lungs are clear. The visualized skeletal structures  are unremarkable. IMPRESSION: No active disease. Electronically Signed   By: Marijo Conception M.D.   On: 03/05/2019 10:36    Procedures Procedures (including critical care time)  Medications Ordered in ED Medications  sodium chloride 0.9 % bolus 1,000 mL (0 mLs Intravenous Stopped 03/05/19 1131)  amLODipine (NORVASC) tablet 10 mg (10 mg Oral Given 03/05/19 1018)     Initial Impression / Assessment and Plan / ED Course  I have reviewed the triage vital signs and the nursing notes.  Pertinent labs & imaging results that were available during my care of the patient were reviewed by me and considered in my medical decision making (see chart for details).       BP (!) 181/101 (BP Location: Left Arm)   Pulse 96   Temp 98.2 F (36.8 C) (Oral)   Resp 16   Ht _0  (1.702 m)   Wt 90.3 kg   SpO2 98%   BMI 31.17 kg/m    Final Clinical Impressions(s) / ED Diagnoses   Final diagnoses:  Hypertensive urgency  Hyperglycemia    ED Discharge Orders         Ordered    Blood Glucose Monitoring Suppl (TRUE METRIX METER) w/Device KIT     03/05/19 1241    amLODipine (NORVASC) 10 MG tablet  Daily     03/05/19 1241    metFORMIN (GLUCOPHAGE) 500 MG tablet  2 times daily with meals     03/05/19 1241    lisinopril (ZESTRIL) 40 MG tablet  Daily    Note to Pharmacy:  Must have office visit for refills   03/05/19 1241         9:59 AM Patient here due to concern of high blood pressure after being out of her medication for approximately 2 weeks.  She does have a documented blood pressure of 187/112.  She does have some mild headache however no focal neuro  deficit acute onset thunderclap headache concerning for subarachnoid hemorrhage.  She does not have any exertional chest pain to suggest ACS.  She however is tachycardic with heart rate of 107 in the room and does endorse some mild shortness of breath.  Will obtain d-dimer to rule out PE.  Patient given Norvasc 10 mg as well as IV fluid.  12:38 PM Negative d-dimer, electrolyte panels are reassuring, with exception of elevated glucose of 417 without any anion gap.  Chest x-ray and troponin and EKG are reassuring.  No concerning arrhythmia.  Blood pressure not significantly improve with a dose of blood pressure medication however at this time patient felt she would like to be discharged and requesting for medication refill which is her primary intention for ER visit.  She understand her blood sugar is elevated and will need to be monitored closely.  Return precaution discussed.   Domenic Moras, PA-C 03/05/19 1243    Veryl Speak, MD 03/05/19 1447

## 2019-03-05 NOTE — ED Notes (Signed)
Pt able to call family/friends and keep them updated.

## 2019-03-05 NOTE — ED Notes (Signed)
Pt ambulatory to the restroom with steady gait.

## 2019-05-05 ENCOUNTER — Ambulatory Visit: Payer: Self-pay | Attending: Nurse Practitioner | Admitting: Nurse Practitioner

## 2019-05-05 ENCOUNTER — Encounter: Payer: Self-pay | Admitting: Nurse Practitioner

## 2019-05-05 ENCOUNTER — Other Ambulatory Visit: Payer: Self-pay

## 2019-05-05 VITALS — BP 157/84 | HR 101 | Temp 99.1°F | Ht 67.0 in | Wt 198.0 lb

## 2019-05-05 DIAGNOSIS — E118 Type 2 diabetes mellitus with unspecified complications: Secondary | ICD-10-CM

## 2019-05-05 DIAGNOSIS — IMO0002 Reserved for concepts with insufficient information to code with codable children: Secondary | ICD-10-CM

## 2019-05-05 DIAGNOSIS — E785 Hyperlipidemia, unspecified: Secondary | ICD-10-CM

## 2019-05-05 DIAGNOSIS — I10 Essential (primary) hypertension: Secondary | ICD-10-CM

## 2019-05-05 DIAGNOSIS — E1169 Type 2 diabetes mellitus with other specified complication: Secondary | ICD-10-CM

## 2019-05-05 DIAGNOSIS — E1165 Type 2 diabetes mellitus with hyperglycemia: Secondary | ICD-10-CM

## 2019-05-05 LAB — GLUCOSE, POCT (MANUAL RESULT ENTRY)
POC Glucose: 303 mg/dl — AB (ref 70–99)
POC Glucose: 249 mg/dl — AB (ref 70–99)

## 2019-05-05 LAB — POCT GLYCOSYLATED HEMOGLOBIN (HGB A1C): Hemoglobin A1C: 10 % — AB (ref 4.0–5.6)

## 2019-05-05 MED ORDER — TRUE METRIX METER W/DEVICE KIT: PACK | 0 refills | Status: DC

## 2019-05-05 MED ORDER — METFORMIN HCL 500 MG PO TABS: 1000.0000 mg | ORAL_TABLET | Freq: Two times a day (BID) | ORAL | 0 refills | Status: DC

## 2019-05-05 MED ORDER — INSULIN ASPART 100 UNIT/ML ~~LOC~~ SOLN
10.0000 [IU] | Freq: Once | SUBCUTANEOUS | Status: AC
  Administered 2019-05-05: 09:00:00 10 [IU] via SUBCUTANEOUS

## 2019-05-05 MED ORDER — GLIMEPIRIDE 4 MG PO TABS: 8.0000 mg | ORAL_TABLET | Freq: Every day | ORAL | 3 refills | Status: DC

## 2019-05-05 MED ORDER — ATORVASTATIN CALCIUM 40 MG PO TABS: 40.0000 mg | ORAL_TABLET | Freq: Every day | ORAL | 0 refills | Status: DC

## 2019-05-05 MED ORDER — AMLODIPINE BESYLATE 10 MG PO TABS: 10.0000 mg | ORAL_TABLET | Freq: Every day | ORAL | 0 refills | Status: DC

## 2019-05-05 MED ORDER — TRUEPLUS LANCETS 28G MISC: 3 refills | Status: DC

## 2019-05-05 MED ORDER — LISINOPRIL 40 MG PO TABS: 40.0000 mg | ORAL_TABLET | Freq: Every day | ORAL | 0 refills | Status: DC

## 2019-05-05 MED ORDER — TRUE METRIX BLOOD GLUCOSE TEST VI STRP: ORAL_STRIP | 12 refills | Status: DC

## 2019-05-05 MED FILL — ?AMLODIPINE BESYLATE 10 MG: 10 | 30 days supply | Qty: 30 | Fill #0

## 2019-05-05 MED FILL — TRUE METRIX TEST STRIP: 25 days supply | Qty: 100 | Fill #0

## 2019-05-05 MED FILL — ?ATORVASTATIN 40MG TABLET: 40 | 30 days supply | Qty: 30 | Fill #0

## 2019-05-05 MED FILL — TRUEplus LANCETS 28G MISC: 25 days supply | Qty: 100 | Fill #0

## 2019-05-05 MED FILL — !TRUE METRIX BLOOD GLUCOSE: 1 days supply | Qty: 1 | Fill #0

## 2019-05-05 MED FILL — LISINOPRIL 40 MG TABLET: 40 | 30 days supply | Qty: 30 | Fill #0

## 2019-05-05 MED FILL — GLIMEPIRIDE 4 MG TABS: 4 | 30 days supply | Qty: 60 | Fill #0

## 2019-05-05 MED FILL — ?METFORMIN HCL 500MG TABLET: 500 | 30 days supply | Qty: 120 | Fill #0

## 2019-05-05 NOTE — Progress Notes (Signed)
Assessment & Plan:  Phyllis Kemp was seen today for establish care.  Diagnoses and all orders for this visit:  Diabetes mellitus type 2, uncontrolled, with complications ([place]) -     Blood Glucose Monitoring Suppl (TRUE METRIX METER) w/Device KIT; Use as instructed -     glimepiride (AMARYL) 4 MG tablet; Take 2 tablets (8 mg total) by mouth daily before breakfast. Patient will pick up scripts today. -     glucose blood (TRUE METRIX BLOOD GLUCOSE TEST) test strip; Use as instructed. Check blood glucose level by fingerstick twice per day. Patient will pick up scripts today. -     metFORMIN (GLUCOPHAGE) 500 MG tablet; Take 2 tablets (1,000 mg total) by mouth 2 (two) times daily with a meal. Patient will pick up scripts today. -     TRUEplus Lancets 28G MISC; Use as instructed. Check blood glucose level by fingerstick twice per day. Patient will pick up scripts today. -     Microalbumin/Creatinine Ratio, Urine -     Glucose (CBG) -     Lipid Panel Continue blood sugar control as discussed in office today, low carbohydrate diet, and regular physical exercise as tolerated, 150 minutes per week (30 min each day, 5 days per week, or 50 min 3 days per week). Keep blood sugar logs with fasting goal of 90-130 mg/dl, post prandial (after you eat) less than 180.  For Hypoglycemia: BS <60 and Hyperglycemia BS >400; contact the clinic ASAP. Annual eye exams and foot exams are recommended.   Hyperlipidemia associated with type 2 diabetes mellitus ([facility]) -     Glucose (CBG) -     HgB A1c -     Cancel: POCT URINALYSIS DIP (CLINITEK) -     insulin aspart (novoLOG) injection 10 Units -     atorvastatin (LIPITOR) 40 MG tablet; Take 1 tablet (40 mg total) by mouth daily. Patient will pick up scripts today. INSTRUCTIONS: Work on a low fat, heart healthy diet and participate in regular aerobic exercise program by working out at least 150 minutes per week; 5 days a week-30 minutes per day. Avoid red meat, fried foods.  junk foods, sodas, sugary drinks, unhealthy snacking, alcohol and smoking.  Drink at least 48oz of water per day and monitor your carbohydrate intake daily.   Essential hypertension -     amLODipine (NORVASC) 10 MG tablet; Take 1 tablet (10 mg total) by mouth daily. Patient will pick up scripts today. -     lisinopril (ZESTRIL) 40 MG tablet; Take 1 tablet (40 mg total) by mouth daily. Continue all antihypertensives as prescribed.  Remember to bring in your blood pressure log with you for your follow up appointment.  DASH/Mediterranean Diets are healthier choices for HTN.     Patient has been counseled on age-appropriate routine health concerns for screening and prevention. These are reviewed and up-to-date. Referrals have been placed accordingly. Immunizations are up-to-date or declined.    Subjective:   Chief Complaint  Patient presents with  . Establish Care    Pt. want to re establish care for blood presure and diabetes.    HPI Phyllis Kemp 48 y.o. female presents to office today to re establish care.  I have not seen her as a patient in this office in over a year and a half.  has a past medical history of Diabetes mellitus type 2 with complications, uncontrolled ([place]) (2003), Hyperlipidemia, and Hypertension (2003).  She has a history of non adherence to medications and office  appointment scheduling. She does not monitor her blood pressure at home. States she lost her glucometer while moving "a while back" and has been out of her medications for some time now. I will restart her medications and have her follow up with the pharmacist for meter check in 4 weeks. She is aware if readings are still high she may be started on an injectable or insulin.   DM TYPE 2 Will refill amaryl 8 mg and metformin 1000 mg BID today. She could not not tolerate janumet in the past.  Lab Results  Component Value Date   HGBA1C 10.0 (A) 05/05/2019    Essential Hypertension Poorly controlled. Will  resume amlodipine 10 mg and lisinopril 63m daily. She is not diet or exercise compliant and was has gradually increased. Denies chest pain, shortness of breath, palpitations, lightheadedness, dizziness, headaches or BLE edema.  BP Readings from Last 3 Encounters:  05/05/19 (!) 157/84  03/05/19 (!) 181/101  11/17/18 (!) 190/111    Hyperlipidemia Patient presents for follow up to hyperlipidemia.  She is not medication compliant. She is not diet compliant and denies statin intolerance including myalgias. Will refill atorvastatin 40 mg today.  Lab Results  Component Value Date   CHOL 213 (H) 09/11/2017   Lab Results  Component Value Date   HDL 69 09/11/2017   Lab Results  Component Value Date   LDLCALC Comment 09/11/2017   Lab Results  Component Value Date   TRIG 412 (H) 09/11/2017   Lab Results  Component Value Date   CHOLHDL 3.1 09/11/2017   Review of Systems  Constitutional: Negative for fever, malaise/fatigue and weight loss.  HENT: Negative.  Negative for nosebleeds.   Eyes: Negative.  Negative for blurred vision, double vision and photophobia.  Respiratory: Negative.  Negative for cough and shortness of breath.   Cardiovascular: Negative.  Negative for chest pain, palpitations and leg swelling.  Gastrointestinal: Negative.  Negative for heartburn, nausea and vomiting.  Musculoskeletal: Negative.  Negative for myalgias.  Neurological: Negative.  Negative for dizziness, focal weakness, seizures and headaches.  Psychiatric/Behavioral: Negative.  Negative for suicidal ideas.    Past Medical History:  Diagnosis Date  . Diabetes mellitus type 2 with complications, uncontrolled ([place] 2003  . Hyperlipidemia   . Hypertension 2003    Past Surgical History:  Procedure Laterality Date  . ABDOMINAL HYSTERECTOMY  2006   partial for fibroid tumors, still has cervix and ovaries   . APPENDECTOMY  1985    Family History  Problem Relation Age of Onset  . Diabetes Mother   .  Hypertension Mother   . Cancer Maternal Aunt        breast   . Depression Maternal Grandmother     Social History Reviewed with no changes to be made today.   Outpatient Medications Prior to Visit  Medication Sig Dispense Refill  . Multiple Vitamin (MULTIVITAMIN) tablet Take 1 tablet by mouth daily.    .Marland Kitchenglucose blood (TRUE METRIX BLOOD GLUCOSE TEST) test strip Use as instructed 100 each 12  . lisinopril (ZESTRIL) 40 MG tablet Take 1 tablet (40 mg total) by mouth daily. 30 tablet 0  . TRUEPLUS LANCETS 28G MISC Use as instructed 100 each 3  . amLODipine (NORVASC) 10 MG tablet Take 1 tablet (10 mg total) by mouth daily for 30 days. 30 tablet 0  . atorvastatin (LIPITOR) 40 MG tablet Take 1 tablet (40 mg total) by mouth daily for 30 days. 30 tablet 0  . Blood Glucose  Monitoring Suppl (TRUE METRIX METER) w/Device KIT Use as instructed (Patient not taking: Reported on 05/05/2019) 1 kit 0  . glimepiride (AMARYL) 4 MG tablet Take 2 tablets (8 mg total) by mouth daily before breakfast for 30 days. 60 tablet 0  . ibuprofen (ADVIL,MOTRIN) 800 MG tablet Take 1 tablet (800 mg total) by mouth 3 (three) times daily. (Patient not taking: Reported on 11/07/2017) 21 tablet 0  . metFORMIN (GLUCOPHAGE) 500 MG tablet Take 2 tablets (1,000 mg total) by mouth 2 (two) times daily with a meal for 30 days. 120 tablet 0   No facility-administered medications prior to visit.     No Known Allergies     Objective:    BP (!) 157/84 (BP Location: Left Arm, Patient Position: Sitting, Cuff Size: Normal)   Pulse (!) 101   Temp 99.1 F (37.3 C) (Oral)   Ht 5' 7"  (1.702 m)   Wt 198 lb (89.8 kg)   SpO2 96%   BMI 31.01 kg/m  Wt Readings from Last 3 Encounters:  05/05/19 198 lb (89.8 kg)  03/05/19 199 lb (90.3 kg)  10/29/17 192 lb 12.8 oz (87.5 kg)    Physical Exam Vitals signs and nursing note reviewed.  Constitutional:      Appearance: She is well-developed.  HENT:     Head: Normocephalic and atraumatic.   Neck:     Musculoskeletal: Normal range of motion.  Cardiovascular:     Rate and Rhythm: Regular rhythm. Tachycardia present.     Heart sounds: Normal heart sounds. No murmur. No friction rub. No gallop.   Pulmonary:     Effort: Pulmonary effort is normal. No tachypnea or respiratory distress.     Breath sounds: Normal breath sounds. No decreased breath sounds, wheezing, rhonchi or rales.  Chest:     Chest wall: No tenderness.  Abdominal:     General: Bowel sounds are normal.     Palpations: Abdomen is soft.  Musculoskeletal: Normal range of motion.  Skin:    General: Skin is warm and dry.  Neurological:     Mental Status: She is alert and oriented to person, place, and time.     Coordination: Coordination normal.  Psychiatric:        Behavior: Behavior normal. Behavior is cooperative.        Thought Content: Thought content normal.        Judgment: Judgment normal.          Patient has been counseled extensively about nutrition and exercise as well as the importance of adherence with medications and regular follow-up. The patient was given clear instructions to go to ER or return to medical center if symptoms don't improve, worsen or new problems develop. The patient verbalized understanding.   Follow-up: Return for 4 weeks luke meter check; 3 months with me.   Gildardo Pounds, FNP-BC [facility] [place], [place]   05/05/2019, 1:34 PM

## 2019-05-07 LAB — LIPID PANEL
Chol/HDL Ratio: 3.3 ratio (ref 0.0–4.4)
Cholesterol, Total: 225 mg/dL — ABNORMAL HIGH (ref 100–199)
HDL: 69 mg/dL (ref >39)
Triglycerides: 450 mg/dL — ABNORMAL HIGH (ref 0–149)

## 2019-05-07 LAB — MICROALBUMIN / CREATININE URINE RATIO
Creatinine, Urine: 70.8 mg/dL
Microalb/Creat Ratio: 158 mg/g creat — ABNORMAL HIGH (ref 0–29)
Microalbumin, Urine: 111.9 ug/mL

## 2019-05-13 ENCOUNTER — Ambulatory Visit: Payer: Self-pay

## 2019-05-21 ENCOUNTER — Other Ambulatory Visit (HOSPITAL_COMMUNITY): Payer: Self-pay | Admitting: *Deleted

## 2019-05-21 DIAGNOSIS — Z1231 Encounter for screening mammogram for malignant neoplasm of breast: Secondary | ICD-10-CM

## 2019-06-05 ENCOUNTER — Ambulatory Visit: Payer: Self-pay | Admitting: Pharmacist

## 2019-06-08 ENCOUNTER — Ambulatory Visit: Payer: Self-pay | Attending: Nurse Practitioner | Admitting: Pharmacist

## 2019-06-08 ENCOUNTER — Encounter: Payer: Self-pay | Admitting: Pharmacist

## 2019-06-08 ENCOUNTER — Other Ambulatory Visit: Payer: Self-pay

## 2019-06-08 DIAGNOSIS — E118 Type 2 diabetes mellitus with unspecified complications: Secondary | ICD-10-CM

## 2019-06-08 DIAGNOSIS — IMO0002 Reserved for concepts with insufficient information to code with codable children: Secondary | ICD-10-CM

## 2019-06-08 DIAGNOSIS — E1165 Type 2 diabetes mellitus with hyperglycemia: Secondary | ICD-10-CM

## 2019-06-08 LAB — GLUCOSE, POCT (MANUAL RESULT ENTRY): POC Glucose: 252 mg/dl — AB (ref 70–99)

## 2019-06-08 MED ORDER — TRULICITY 0.75 MG/0.5ML ~~LOC~~ SOAJ: 0.7500 mg | SUBCUTANEOUS | 2 refills | Status: DC

## 2019-06-08 MED FILL — TRULICITY 0.75 MG/0.5 ML PE: 0.75 | 28 days supply | Qty: 2 | Fill #0

## 2019-06-08 NOTE — Progress Notes (Signed)
    S:    PCP: Zelda   No chief complaint on file.  Patient arrives in good spirits.  Presents for diabetes evaluation, education, and management Patient was referred and last seen by Primary Care Provider on 05/05/19.   Patient reports Diabetes was diagnosed in 2003.   Family/Social History:  - FHx: DM & HTN (mother) - Tobacco: never smoker - Alcohol: reports that she's trying to cut back  Insurance coverage/medication affordability: self ppay  Patient reports adherence with medications.  Current diabetes medications include: metformin 1000 mg BID, glimepiride 8 mg daily Current hypertension medications include: amlodipine 10 mg daily, lisinopril 40 mg daily Current hyperlipidemia medications include: atorvastatin 40 mg daily  Patient denies hypoglycemic events.  Patient reported dietary habits: - Denies sweets, sweetened beverages  - Admits to heavy consumption of rice; occasionally has potatoes  - "I love fresh vegetables and fruit"  Patient-reported exercise habits:  - Denies    Patient reports nocturia.  Patient reports neuropathy. Patient reports visual changes. Patient reports self foot exams.     O:  POCT glucose: 252  Lab Results  Component Value Date   HGBA1C 10.0 (A) 05/05/2019   There were no vitals filed for this visit.  Lipid Panel     Component Value Date/Time   CHOL 225 (H) 05/05/2019 1009   TRIG 450 (H) 05/05/2019 1009   HDL 69 05/05/2019 1009   CHOLHDL 3.3 05/05/2019 1009   CHOLHDL 3.4 10/18/2014 1516   VLDL NOT CALC 10/18/2014 1516   LDLCALC Comment 05/05/2019 1009    Home fasting CBG: 170 - 200    Clinical ASCVD: No  The 10-year ASCVD risk score Mikey Bussing DC Jr., et al., 2013) is: 12.8%   Values used to calculate the score:     Age: 48 years     Sex: Female     Is Non-Hispanic African American: Yes     Diabetic: Yes     Tobacco smoker: No     Systolic Blood Pressure: 852 mmHg     Is BP treated: Yes     HDL Cholesterol: 69 mg/dL      Total Cholesterol: 225 mg/dL    A/P: Diabetes longstanding currently uncontrolled. Patient is not able to verbalize appropriate hypoglycemia management plan. Patient is adherent with medication. Control is suboptimal due to dietary indiscretion and physical inactivity. -Start weekly Trulicity. Teaching, counseling given. -Continue metformin 1000 mg BID -Extensively discussed pathophysiology of DM, recommended lifestyle interventions, dietary effects on glycemic control -Counseled on s/sx of and management of hypoglycemia -Next A1C anticipated 07/2019.   ASCVD risk - primary prevention in patient with DM. Last LDL is not controlled. ASCVD risk score is not >20%  But she likely has far to go to achieve LDL goal. Recommend to continue atorvastatin 40 mg daily.   -Continued atorvastatin 40 mg.   HM: pt defers to get influenza and PPSV-23 at next visit.    Written patient instructions provided.  Total time in face to face counseling 30 minutes.   Follow up Pharmacist Clinic Visit in 07/08/19.    Benard Halsted, PharmD, [place] [phone]

## 2019-06-22 MED FILL — LISINOPRIL 40 MG TABLET: 40 | 30 days supply | Qty: 30 | Fill #1

## 2019-06-22 MED FILL — ?ATORVASTATIN 40MG TABLET: 40 | 30 days supply | Qty: 30 | Fill #1

## 2019-06-22 MED FILL — ?AMLODIPINE BESYLATE 10 MG: 10 | 30 days supply | Qty: 30 | Fill #1

## 2019-07-08 ENCOUNTER — Ambulatory Visit: Payer: Self-pay | Admitting: Pharmacist

## 2019-07-08 NOTE — Progress Notes (Deleted)
    S:    PCP: Zelda   No chief complaint on file.  Patient arrives in good spirits.  Presents for diabetes evaluation, education, and management Patient was referred and last seen by Primary Care Provider on 05/05/19. I saw her on 8/25 and added Trulicity to her regimen.   Patient reports diabetes was diagnosed in 2003.   Family/Social History:  - FHx: DM & HTN (mother) - Tobacco: never smoker - Alcohol: reports that she's trying to cut back  Insurance coverage/medication affordability: self ppay  Patient reports adherence with medications.  Current diabetes medications include: metformin 1000 mg BID, glimepiride 8 mg daily, Trulicity 0.53 mg once weekly  Current hypertension medications include: amlodipine 10 mg daily, lisinopril 40 mg daily Current hyperlipidemia medications include: atorvastatin 40 mg daily  Patient *** hypoglycemic events.  Patient reported dietary habits: - Denies sweets, sweetened beverages  - Admits to heavy consumption of rice; occasionally has potatoes  - "I love fresh vegetables and fruit" - Changes ***  Patient-reported exercise habits:  - Denies - Changes; ***   Patient reports nocturia.  Patient reports neuropathy. Patient reports visual changes. Patient reports self foot exams.     O:  POCT glucose: ***  Lab Results  Component Value Date   HGBA1C 10.0 (A) 05/05/2019   There were no vitals filed for this visit.  Lipid Panel     Component Value Date/Time   CHOL 225 (H) 05/05/2019 1009   TRIG 450 (H) 05/05/2019 1009   HDL 69 05/05/2019 1009   CHOLHDL 3.3 05/05/2019 1009   CHOLHDL 3.4 10/18/2014 1516   VLDL NOT CALC 10/18/2014 1516   LDLCALC Comment 05/05/2019 1009    Home fasting CBG: *** (170 - 200 previously)    Clinical ASCVD: No  The 10-year ASCVD risk score Mikey Bussing DC Jr., et al., 2013) is: 12.8%   Values used to calculate the score:     Age: 48 years     Sex: Female     Is Non-Hispanic African American: Yes  Diabetic: Yes     Tobacco smoker: No     Systolic Blood Pressure: 976 mmHg     Is BP treated: Yes     HDL Cholesterol: 69 mg/dL     Total Cholesterol: 225 mg/dL    A/P: Diabetes longstanding currently uncontrolled. Patient is not able to verbalize appropriate hypoglycemia management plan. Patient is adherent with medication. Control is suboptimal due to dietary indiscretion and physical inactivity. -Start weekly Trulicity. Teaching, counseling given. -Continue metformin 1000 mg BID -Extensively discussed pathophysiology of DM, recommended lifestyle interventions, dietary effects on glycemic control -Counseled on s/sx of and management of hypoglycemia -Next A1C anticipated 07/2019.   ASCVD risk - primary prevention in patient with DM. Last LDL is not controlled. ASCVD risk score is not >20%  But she likely has far to go to achieve LDL goal. Recommend to continue atorvastatin 40 mg daily.   -Continued atorvastatin 40 mg.   HM: pt defers to get influenza and PPSV-23 at next visit.    Written patient instructions provided.  Total time in face to face counseling 30 minutes.   Follow up Pharmacist Clinic Visit in 07/08/19.    Benard Halsted, PharmD, [place] [phone]

## 2019-07-09 ENCOUNTER — Ambulatory Visit (HOSPITAL_COMMUNITY): Payer: Self-pay

## 2019-08-04 ENCOUNTER — Ambulatory Visit: Payer: Self-pay | Attending: Nurse Practitioner | Admitting: Nurse Practitioner

## 2019-08-04 ENCOUNTER — Other Ambulatory Visit: Payer: Self-pay

## 2019-08-04 ENCOUNTER — Encounter: Payer: Self-pay | Admitting: Nurse Practitioner

## 2019-08-04 VITALS — BP 148/88 | HR 105 | Temp 98.6°F | Ht 67.0 in | Wt 201.0 lb

## 2019-08-04 DIAGNOSIS — E118 Type 2 diabetes mellitus with unspecified complications: Secondary | ICD-10-CM

## 2019-08-04 DIAGNOSIS — I10 Essential (primary) hypertension: Secondary | ICD-10-CM

## 2019-08-04 DIAGNOSIS — E785 Hyperlipidemia, unspecified: Secondary | ICD-10-CM

## 2019-08-04 DIAGNOSIS — F1010 Alcohol abuse, uncomplicated: Secondary | ICD-10-CM

## 2019-08-04 DIAGNOSIS — E1165 Type 2 diabetes mellitus with hyperglycemia: Secondary | ICD-10-CM

## 2019-08-04 DIAGNOSIS — IMO0002 Reserved for concepts with insufficient information to code with codable children: Secondary | ICD-10-CM

## 2019-08-04 DIAGNOSIS — E559 Vitamin D deficiency, unspecified: Secondary | ICD-10-CM

## 2019-08-04 LAB — POCT GLYCOSYLATED HEMOGLOBIN (HGB A1C): Hemoglobin A1C: 9.5 % — AB (ref 4.0–5.6)

## 2019-08-04 LAB — GLUCOSE, POCT (MANUAL RESULT ENTRY): POC Glucose: 299 mg/dl — AB (ref 70–99)

## 2019-08-04 MED ORDER — TRUE METRIX BLOOD GLUCOSE TEST VI STRP: ORAL_STRIP | 12 refills | Status: DC

## 2019-08-04 MED ORDER — LISINOPRIL 40 MG PO TABS: 40.0000 mg | ORAL_TABLET | Freq: Every day | ORAL | 0 refills | Status: DC

## 2019-08-04 MED ORDER — TRULICITY 0.75 MG/0.5ML ~~LOC~~ SOAJ: 0.7500 mg | SUBCUTANEOUS | 2 refills | Status: DC

## 2019-08-04 MED ORDER — AMLODIPINE BESYLATE 10 MG PO TABS: 10.0000 mg | ORAL_TABLET | Freq: Every day | ORAL | 0 refills | Status: DC

## 2019-08-04 MED ORDER — TRUEPLUS LANCETS 28G MISC: 3 refills | Status: DC

## 2019-08-04 MED ORDER — ATORVASTATIN CALCIUM 40 MG PO TABS: 40.0000 mg | ORAL_TABLET | Freq: Every day | ORAL | 0 refills | Status: DC

## 2019-08-04 MED ORDER — GLIMEPIRIDE 4 MG PO TABS: 8.0000 mg | ORAL_TABLET | Freq: Every day | ORAL | 3 refills | Status: DC

## 2019-08-04 MED ORDER — METFORMIN HCL 500 MG PO TABS: 1000.0000 mg | ORAL_TABLET | Freq: Two times a day (BID) | ORAL | 0 refills | Status: DC

## 2019-08-04 MED FILL — ?AMLODIPINE BESYLATE 10 MG: 10 | 30 days supply | Qty: 30 | Fill #0

## 2019-08-04 MED FILL — LISINOPRIL 40 MG TABLET: 40 | 30 days supply | Qty: 30 | Fill #0

## 2019-08-04 MED FILL — TRULICITY 0.75 MG/0.5 ML PE: 0.75 | 30 days supply | Qty: 2 | Fill #0

## 2019-08-04 MED FILL — GLIMEPIRIDE 4 MG TABS: 4 | 30 days supply | Qty: 60 | Fill #0

## 2019-08-04 MED FILL — ?METFORMIN HCL 500MG TABLET: 500 | 30 days supply | Qty: 120 | Fill #0

## 2019-08-04 MED FILL — ?ATORVASTATIN 40MG TABLET: 40 | 30 days supply | Qty: 30 | Fill #0

## 2019-08-04 NOTE — Progress Notes (Signed)
Assessment & Plan:  Phyllis Kemp was seen today for follow-up.  Diagnoses and all orders for this visit:  Diabetes mellitus type 2, uncontrolled, with complications ([place]) -     Glucose (CBG) -     HgB A1c -     Ambulatory referral to Ophthalmology -     metFORMIN (GLUCOPHAGE) 500 MG tablet; Take 2 tablets (1,000 mg total) by mouth 2 (two) times daily with a meal. Patient will pick up scripts today. -     glimepiride (AMARYL) 4 MG tablet; Take 2 tablets (8 mg total) by mouth daily before breakfast. Patient will pick up scripts today. -     Dulaglutide (TRULICITY) 2.45 YK/9.9IP SOPN; Inject 0.75 mg into the skin once a week. -     glucose blood (TRUE METRIX BLOOD GLUCOSE TEST) test strip; Use as instructed. Check blood glucose level by fingerstick twice per day. Patient will pick up scripts today. -     TRUEplus Lancets 28G MISC; Use as instructed. Check blood glucose level by fingerstick twice per day. Patient will pick up scripts today. Continue blood sugar control as discussed in office today, low carbohydrate diet, and regular physical exercise as tolerated, 150 minutes per week (30 min each day, 5 days per week, or 50 min 3 days per week). Keep blood sugar logs with fasting goal of 90-130 mg/dl, post prandial (after you eat) less than 180.  For Hypoglycemia: BS <60 and Hyperglycemia BS >400; contact the clinic ASAP. Annual eye exams and foot exams are recommended.  Essential hypertension -     CMP14+EGFR -     lisinopril (ZESTRIL) 40 MG tablet; Take 1 tablet (40 mg total) by mouth daily. -     amLODipine (NORVASC) 10 MG tablet; Take 1 tablet (10 mg total) by mouth daily. Patient will pick up scripts today. Continue all antihypertensives as prescribed.  Remember to bring in your blood pressure log with you for your follow up appointment.  DASH/Mediterranean Diets are healthier choices for HTN.    Hyperlipidemia LDL goal <70 -     Lipid panel -     atorvastatin (LIPITOR) 40 MG tablet;  Take 1 tablet (40 mg total) by mouth daily. Patient will pick up scripts today. INSTRUCTIONS: Work on a low fat, heart healthy diet and participate in regular aerobic exercise program by working out at least 150 minutes per week; 5 days a week-30 minutes per day. Avoid red meat/beef/steak,  fried foods. junk foods, sodas, sugary drinks, unhealthy snacking, alcohol and smoking.  Drink at least 80 oz of water per day and monitor your carbohydrate intake daily.   Alcohol abuse -     Vitamin B12 Counseled on substance abuse and resources given as references for therapy Encouraged to stop drinking and discussed the complications of alcohol, poorly controlled DM, HTN and HPL.   Vitamin D deficiency disease -     VITAMIN D 25 Hydroxy (Vit-D Deficiency, Fractures)    Patient has been counseled on age-appropriate routine health concerns for screening and prevention. These are reviewed and up-to-date. Referrals have been placed accordingly. Immunizations are up-to-date or declined.    Subjective:   Chief Complaint  Patient presents with  . Follow-up    Pt. is here to follow up on diabetes.    HPI Phyllis Kemp 48 y.o. female presents to office today for follow up.  has a past medical history of Diabetes mellitus type 2 with complications, uncontrolled ([place]) (2003), Hyperlipidemia, and Hypertension (2003).  Long  standing history of poor adherence to diet, exercise and medications.   We had a long discussion today regarding her nonadherence and Phyllis Kemp endorses significant alcohol use.  Drinking several glasses of vodka every night.  I have given her resources for substance abuse counseling.  Her vodka use is likely affecting her medication compliance.   Hypertension She is not exercising and is adherent to low salt diet.  She does not have a blood pressure log  today. Current medications:  amlodipine 10 mg and lisinopril 40 mg daily.  Blood pressure is not well controlled at home.  Denies chest  pain, shortness of breath, palpitations, lightheadedness, dizziness, headaches or BLE edema.  BP Readings from Last 3 Encounters:  08/04/19 (!) 156/88  05/05/19 (!) 157/84  03/05/19 (!) 181/101    Hyperlipidemia Patient presents for follow up to hyperlipidemia.  She is medication compliant with taking atorvastatin 40 mg daily however alcohol use is likely contributing to elevated cholesterol levels. She is not diet compliant and denies  statin intolerance including myalgias.  Lab Results  Component Value Date   CHOL 225 (H) 05/05/2019   Lab Results  Component Value Date   HDL 69 05/05/2019   Lab Results  Component Value Date   LDLCALC Comment 05/05/2019   Lab Results  Component Value Date   TRIG 450 (H) 05/05/2019   Lab Results  Component Value Date   CHOLHDL 3.3 05/05/2019      Diabetes Mellitus Type II Not well controlled. She was not aware she had been prescribed trulicity so she has not been administering this. Current symptoms/problems include hyperglycemia, paresthesia of the feet and visual disturbances and have been unchanged.  Known diabetic complications: peripheral neuropathy Cardiovascular risk factors: diabetes mellitus, dyslipidemia, hypertension and obesity (BMI >= 30 kg/m2) Current diabetic medications include: metformin 1000 mg and glimepiride 8 mg daily.  Eye exam current (within one year): No: Refer to ophthalmology today however she is uninsured. Weight trend: increasing steadily Prior visit with dietician: no Current monitoring regimen: office lab tests - Quarterly Home blood sugar records: Greater than 200s postprandial although she is not monitoring her blood glucose levels at home daily Any episodes of hypoglycemia? no Is She on ACE inhibitor or angiotensin II receptor blocker?  Yes  Lab Results  Component Value Date   HGBA1C 9.5 (A) 08/04/2019   HGBA1C 10.0 (A) 05/05/2019   HGBA1C 10.7 09/11/2017    Review of Systems  Constitutional:  Negative for fever, malaise/fatigue and weight loss.  HENT: Negative.  Negative for nosebleeds.   Eyes: Positive for blurred vision. Negative for double vision, photophobia, pain, discharge and redness.  Respiratory: Negative.  Negative for cough and shortness of breath.   Cardiovascular: Negative.  Negative for chest pain, palpitations and leg swelling.  Gastrointestinal: Negative.  Negative for heartburn, nausea and vomiting.  Musculoskeletal: Negative.  Negative for myalgias.  Neurological: Positive for sensory change. Negative for dizziness, focal weakness, seizures and headaches.  Psychiatric/Behavioral: Positive for substance abuse. Negative for suicidal ideas.    Past Medical History:  Diagnosis Date  . Diabetes mellitus type 2 with complications, uncontrolled ([place]) 2003  . Hyperlipidemia   . Hypertension 2003    Past Surgical History:  Procedure Laterality Date  . ABDOMINAL HYSTERECTOMY  2006   partial for fibroid tumors, still has cervix and ovaries   . APPENDECTOMY  1985    Family History  Problem Relation Age of Onset  . Diabetes Mother   . Hypertension Mother   .  Cancer Maternal Aunt        breast   . Depression Maternal Grandmother     Social History Reviewed with no changes to be made today.   Outpatient Medications Prior to Visit  Medication Sig Dispense Refill  . Blood Glucose Monitoring Suppl (TRUE METRIX METER) w/Device KIT Use as instructed 1 kit 0  . Dulaglutide (TRULICITY) 6.29 BM/8.4XL SOPN Inject 0.75 mg into the skin once a week. 2 mL 2  . glucose blood (TRUE METRIX BLOOD GLUCOSE TEST) test strip Use as instructed. Check blood glucose level by fingerstick twice per day. Patient will pick up scripts today. 100 each 12  . TRUEplus Lancets 28G MISC Use as instructed. Check blood glucose level by fingerstick twice per day. Patient will pick up scripts today. 100 each 3  . Multiple Vitamin (MULTIVITAMIN) tablet Take 1 tablet by mouth daily.    Marland Kitchen  amLODipine (NORVASC) 10 MG tablet Take 1 tablet (10 mg total) by mouth daily. Patient will pick up scripts today. 90 tablet 0  . atorvastatin (LIPITOR) 40 MG tablet Take 1 tablet (40 mg total) by mouth daily. Patient will pick up scripts today. 90 tablet 0  . glimepiride (AMARYL) 4 MG tablet Take 2 tablets (8 mg total) by mouth daily before breakfast. Patient will pick up scripts today. 60 tablet 3  . lisinopril (ZESTRIL) 40 MG tablet Take 1 tablet (40 mg total) by mouth daily. 90 tablet 0  . metFORMIN (GLUCOPHAGE) 500 MG tablet Take 2 tablets (1,000 mg total) by mouth 2 (two) times daily with a meal. Patient will pick up scripts today. 360 tablet 0   No facility-administered medications prior to visit.     No Known Allergies     Objective:    BP (!) 156/88 (BP Location: Right Arm, Patient Position: Sitting, Cuff Size: Normal)   Pulse (!) 105   Temp 98.6 F (37 C) (Oral)   Ht 5' 7"  (1.702 m)   Wt 201 lb (91.2 kg)   SpO2 97%   BMI 31.48 kg/m  Wt Readings from Last 3 Encounters:  08/04/19 201 lb (91.2 kg)  05/05/19 198 lb (89.8 kg)  03/05/19 199 lb (90.3 kg)    Physical Exam Vitals signs and nursing note reviewed.  Constitutional:      Appearance: She is well-developed.  HENT:     Head: Normocephalic and atraumatic.  Neck:     Musculoskeletal: Normal range of motion.  Cardiovascular:     Rate and Rhythm: Normal rate and regular rhythm.     Pulses:          Dorsalis pedis pulses are 1+ on the right side and 1+ on the left side.       Posterior tibial pulses are 1+ on the right side and 1+ on the left side.     Heart sounds: Normal heart sounds. No murmur. No friction rub. No gallop.   Pulmonary:     Effort: Pulmonary effort is normal. No tachypnea or respiratory distress.     Breath sounds: Normal breath sounds. No decreased breath sounds, wheezing, rhonchi or rales.  Chest:     Chest wall: No tenderness.  Abdominal:     General: Abdomen is protuberant. Bowel sounds are  normal.     Palpations: Abdomen is soft.  Musculoskeletal: Normal range of motion.  Feet:     Right foot:     Protective Sensation: 10 sites tested. 8 sites sensed.     Skin integrity: Callus  and dry skin present. No skin breakdown.     Left foot:     Protective Sensation: 10 sites tested. 7 sites sensed.     Skin integrity: Callus and dry skin present. No skin breakdown.  Skin:    General: Skin is warm and dry.  Neurological:     Mental Status: She is alert and oriented to person, place, and time.     Coordination: Coordination normal.  Psychiatric:        Behavior: Behavior normal. Behavior is cooperative.        Thought Content: Thought content normal.        Judgment: Judgment normal.        Patient has been counseled extensively about nutrition and exercise as well as the importance of adherence with medications and regular follow-up. The patient was given clear instructions to go to ER or return to medical center if symptoms don't improve, worsen or new problems develop. The patient verbalized understanding.   Follow-up: Return for 4 weeks meter check LUKE/3 mths with me.   Gildardo Pounds, FNP-BC [facility] [place], [place]   08/04/2019, 9:19 AM

## 2019-08-04 NOTE — Patient Instructions (Signed)
Behavioral Health Resources:  ? ?What if I or someone I know is in crisis? ? ?If you are thinking about harming yourself or having thoughts of suicide, or if you know someone who is, seek help right away. ? ?Call your doctor or mental health care provider. ? ?Call 911 or go to a hospital emergency room to get immediate help, or ask a friend or family member to help you do these things. ? ?Call the [place] National Suicide Prevention Lifeline?s toll-free, 24-hour hotline at 1-800-273-TALK (1-800-273-8255) or TTY: 1-800-799-4 TTY (1-800-799-4889) to talk to a trained counselor. ? ?If you are in crisis, make sure you are not left alone.  ? ?If someone else is in crisis, make sure he or she is not left alone ? ? ?24 Hour Availability ? ?[facility]  ?[address], [place], [place] [address]  ?336-832-9700 or 1-800-711-2635 ? ?[facility] Crisis Line ?(Domestic Violence, Rape & Victim Assistance ?336-273-7273 ? ?[facility]  ?[address] [place], [place]  [address]               1-855-788-8787 or 336-676-6840 ? ?[facility] [place] [facility]    ?(ONLY from 8am-4pm)    ?336-899-1505 ? ?[facility] (24/7)   ?1-877-626-1772 ? ?[place] National Suicide Hotline   ?1-800-273-8255 (TALK) ? ?Support from local police to aid getting patient to hospital (http://www.[place].gov/index.aspx?page=2797) ? ? ?      ?ONGOING BEHAVIORAL HEALTH SUPPORT FOR UNINSURED and UNDERINSURED:  ?[facility]  ?336-676-6840  ?[address]  ?Walk-in first time, Monday-Friday, 8:30am-5:00pm  ?*Bring snack, drink, something to do, long wait at first visit, they do have pharmacy for behavioral health medications/ Bring own interpreter at first visit, if needed ?[facility]  ?336-387-6161  ?[address]  ?Walk-in Monday-Friday, 8:30am-12pm & 1-2:30pm  ?*pacientes que hablen espanol, favor comunicarse con el Sr.  [place], extension 2244 o la [address], extension 3331 para hacer una cita  ?[facility]:  ?336-429-5600 or kellinfoundation@gmail.com  ?[address], [address]  ?Call or email, may self-refer  ?* uninsured/underinsured, 19-64yo, have both mental health and substance use challenges  ?[facility]:  ?Phone (336) [phone]; Fax [phone]) [phone]  ?*Call to schedule an appointment  ?3rd Floor located @?[address], [address] of [address]  ?Mon-Thursday: 8:30am-8:00pm Friday: 8:30am-7:00pm  ?* Be sure to park in a space labeled ?Psychology Department,? located to the right of the main door of the building. Enter the main doors facing the parking lot and take the elevator or stairs to the 3rd Floor.  ?[facility]:  ?336-832-9700 or  ?1-800-711-2635 (24/hour helpline)  ?[address]  ?Call to make appointment, tends to be a long wait to begin services, depending on insurance  ?[facility]  ?(336) 333-6860 ??  ?*Call to schedule an appointment  ?[address]  ?Monday-Friday, 8:00am-5:00pm  ?[facility]  ?(336) [phone]  ?[address], [place]  ?Monday-Friday, walk-in 8am-3pm  ?First appointment is assessment, then will make appointment for psychiatry   ?  ?

## 2019-08-05 ENCOUNTER — Ambulatory Visit: Payer: Self-pay | Admitting: Nurse Practitioner

## 2019-08-05 LAB — CMP14+EGFR
ALT: 25 IU/L (ref 0–32)
AST: 26 IU/L (ref 0–40)
Albumin/Globulin Ratio: 1.8 (ref 1.2–2.2)
Albumin: 4.6 g/dL (ref 3.8–4.8)
Alkaline Phosphatase: 49 IU/L (ref 39–117)
BUN/Creatinine Ratio: 20 (ref 9–23)
BUN: 14 mg/dL (ref 6–24)
Bilirubin Total: 0.8 mg/dL (ref 0.0–1.2)
CO2: 23 mmol/L (ref 20–29)
Calcium: 10.1 mg/dL (ref 8.7–10.2)
Chloride: 96 mmol/L (ref 96–106)
Creatinine, Ser: 0.7 mg/dL (ref 0.57–1.00)
GFR calc Af Amer: 118 mL/min/{1.73_m2} (ref >59)
GFR calc non Af Amer: 103 mL/min/{1.73_m2} (ref >59)
Globulin, Total: 2.6 g/dL (ref 1.5–4.5)
Glucose: 276 mg/dL — ABNORMAL HIGH (ref 65–99)
Potassium: 4.4 mmol/L (ref 3.5–5.2)
Sodium: 134 mmol/L (ref 134–144)
Total Protein: 7.2 g/dL (ref 6.0–8.5)

## 2019-08-05 LAB — LIPID PANEL
Chol/HDL Ratio: 3.6 ratio (ref 0.0–4.4)
Cholesterol, Total: 220 mg/dL — ABNORMAL HIGH (ref 100–199)
HDL: 61 mg/dL (ref >39)
LDL Chol Calc (NIH): 56 mg/dL (ref 0–99)
Triglycerides: 703 mg/dL (ref 0–149)
VLDL Cholesterol Cal: 103 mg/dL — ABNORMAL HIGH (ref 5–40)

## 2019-08-05 LAB — VITAMIN D 25 HYDROXY (VIT D DEFICIENCY, FRACTURES): Vit D, 25-Hydroxy: 11.9 ng/mL — ABNORMAL LOW (ref 30.0–100.0)

## 2019-08-05 LAB — VITAMIN B12: Vitamin B-12: 395 pg/mL (ref 232–1245)

## 2019-08-10 ENCOUNTER — Other Ambulatory Visit: Payer: Self-pay | Admitting: Nurse Practitioner

## 2019-08-10 MED ORDER — FENOFIBRATE 48 MG PO TABS: 48.0000 mg | ORAL_TABLET | Freq: Every day | ORAL | 2 refills | Status: DC

## 2019-08-11 MED FILL — FENOFIBRATE 48 MG TABLET: 48 | 30 days supply | Qty: 30 | Fill #0

## 2019-09-07 ENCOUNTER — Ambulatory Visit: Payer: Self-pay | Admitting: Pharmacist

## 2019-09-15 ENCOUNTER — Ambulatory Visit
Admission: RE | Admit: 2019-09-15 | Discharge: 2019-09-15 | Disposition: A | Discharge status: Home / Self Care | Payer: No Typology Code available for payment source | Source: Ambulatory Visit | Attending: Obstetrics and Gynecology | Admitting: Obstetrics and Gynecology

## 2019-09-15 ENCOUNTER — Ambulatory Visit (HOSPITAL_COMMUNITY)
Admission: RE | Admit: 2019-09-15 | Discharge: 2019-09-15 | Disposition: A | Discharge status: Home / Self Care | Payer: Self-pay | Source: Ambulatory Visit | Attending: Obstetrics and Gynecology | Admitting: Obstetrics and Gynecology

## 2019-09-15 ENCOUNTER — Encounter (HOSPITAL_COMMUNITY): Payer: Self-pay

## 2019-09-15 ENCOUNTER — Other Ambulatory Visit: Payer: Self-pay

## 2019-09-15 DIAGNOSIS — Z1231 Encounter for screening mammogram for malignant neoplasm of breast: Secondary | ICD-10-CM

## 2019-09-15 DIAGNOSIS — Z01419 Encounter for gynecological examination (general) (routine) without abnormal findings: Secondary | ICD-10-CM

## 2019-09-15 NOTE — Patient Instructions (Addendum)
Explained breast self awareness with Veryl Speak. Let patient know that if today's Pap smear is normal that her next Pap smear is due in one year due to her last Pap smear was abnormal. Referred patient to the [facility] for a screening mammogram. Appointment scheduled for Tuesday, September 15, 2019 at 1110. Patient aware of appointment and will be there. Let patient know will follow up with her within the next couple weeks with results with results of Pap smear and wet prep by phone. Informed patient that the [facility] will follow-up with her within the next couple of weeks with results of her mammogram by letter or phone. Phyllis Kemp verbalized understanding.  Chidera Thivierge, Arvil Chaco, RN 10:29 AM

## 2019-09-15 NOTE — Progress Notes (Signed)
No complaints today.   Pap Smear: Pap smear completed today. Last Pap smear was 11/07/2017 at [facility] and ASCUS with negative HPV. Per patient has no history of an abnormal Pap smear. Patient has a history of a Supracervical hysterectomy in 2006 due to fibroids. Last Pap smear result is in Epic.  Physical exam: Breasts Breasts symmetrical. No skin abnormalities bilateral breasts. No nipple retraction bilateral breasts. No nipple discharge bilateral breasts. No lymphadenopathy. No lumps palpated bilateral breasts. No complaints of pain or tenderness on exam. Referred patient to the [facility] for a screening mammogram. Appointment scheduled for Tuesday, September 15, 2019 at 1110.        Pelvic/Bimanual   Ext Genitalia No lesions, no swelling and no discharge observed on external genitalia.         Vagina Vagina pink and normal texture. No lesions and thick white yeast appearing discharge observed in vagina. Wet prep completed.          Cervix Cervix is present. Cervix pink and of normal texture. Thick white yeast appearing discharge on cervix.    Uterus Uterus is absent due to history of Supracervical hysterectomy for benign reasons.       Adnexae Bilateral ovaries present and palpable. No tenderness on palpation.         Rectovaginal No rectal exam completed today since patient had no rectal complaints. No skin abnormalities observed on exam.    Smoking History: Patient has never smoked.  Patient Navigation: Patient education provided. Access to services provided for patient through [facility] program.   Breast and Cervical Cancer Risk Assessment: Patient has a family history of a maternal aunt having breast cancer. Patient has no known genetic mutations, or history of radiation treatment to the chest before age 40. Patient has no history of cervical dysplasia, immunocompromised, or DES exposure in-utero.  Risk Assessment    Risk Scores      09/15/2019   Last  edited by: Loletta Parish, RN   5-year risk: 1 %   Lifetime risk: 8.9 %

## 2019-09-16 LAB — CERVICOVAGINAL ANCILLARY ONLY
Bacterial Vaginitis (gardnerella): NEGATIVE
Candida Glabrata: NEGATIVE
Candida Vaginitis: NEGATIVE
Comment: NEGATIVE
Comment: NEGATIVE
Comment: NEGATIVE
Comment: NEGATIVE
Trichomonas: POSITIVE — AB

## 2019-09-17 LAB — CYTOLOGY - PAP
Adequacy: ABSENT
Comment: NEGATIVE
High risk HPV: NEGATIVE

## 2019-09-21 ENCOUNTER — Other Ambulatory Visit (HOSPITAL_COMMUNITY): Payer: Self-pay | Admitting: *Deleted

## 2019-09-23 ENCOUNTER — Other Ambulatory Visit (HOSPITAL_COMMUNITY): Payer: Self-pay | Admitting: *Deleted

## 2019-09-23 ENCOUNTER — Telehealth (HOSPITAL_COMMUNITY): Payer: Self-pay | Admitting: *Deleted

## 2019-09-23 MED ORDER — METRONIDAZOLE 500 MG PO TABS: 500.0000 mg | ORAL_TABLET | Freq: Two times a day (BID) | ORAL | 0 refills | Status: AC

## 2019-09-23 MED FILL — metroNIDAZOLE 500 MG TABS: 500 | 7 days supply | Qty: 14 | Fill #0

## 2019-09-23 NOTE — Telephone Encounter (Signed)
Called patient to discuss Pap smear results and give follow-up appointment. Let patient know that her Pap smear showed some abnormal low grade cells, the HPV was negative, and that it is recommended for her to have a colposcopy to follow-up. Explained to patient the colposcopy. Gave patient follow-up appointment at [facility] 10/26/2019 at 1535 and explained appointment is covered by [facility]. Told patient that her Pap smear showed Trichomonas and that a prescription for Flagyl has been sent to her pharmacy for treatment. Verified pharmacy. Explained to patient that the prescription should be taken BID x 7 days. Advised patient to avoid alcohol while taking prescription. Told patient that her partner will need to be treated for Trichomonas to avoid re-infection. Patient verbalized understanding.

## 2019-09-28 MED FILL — ?AMLODIPINE BESYLATE 10 MG: 10 | 30 days supply | Qty: 30 | Fill #1

## 2019-09-28 MED FILL — ?ATORVASTATIN 40MG TABLET: 40 | 30 days supply | Qty: 30 | Fill #1

## 2019-09-28 MED FILL — LISINOPRIL 40 MG TABLET: 40 | 30 days supply | Qty: 30 | Fill #1

## 2019-10-26 ENCOUNTER — Encounter: Payer: Self-pay | Admitting: Obstetrics and Gynecology

## 2019-10-26 ENCOUNTER — Other Ambulatory Visit (HOSPITAL_COMMUNITY)
Admission: RE | Admit: 2019-10-26 | Discharge: 2019-10-26 | Disposition: A | Discharge status: Home / Self Care | Payer: No Typology Code available for payment source | Source: Ambulatory Visit | Attending: Obstetrics and Gynecology | Admitting: Obstetrics and Gynecology

## 2019-10-26 ENCOUNTER — Ambulatory Visit (INDEPENDENT_AMBULATORY_CARE_PROVIDER_SITE_OTHER): Payer: Self-pay | Admitting: Obstetrics and Gynecology

## 2019-10-26 ENCOUNTER — Other Ambulatory Visit: Payer: Self-pay

## 2019-10-26 VITALS — BP 151/84 | HR 103 | Wt 198.7 lb

## 2019-10-26 DIAGNOSIS — N870 Mild cervical dysplasia: Secondary | ICD-10-CM

## 2019-10-26 DIAGNOSIS — N939 Abnormal uterine and vaginal bleeding, unspecified: Secondary | ICD-10-CM

## 2019-10-26 DIAGNOSIS — N879 Dysplasia of cervix uteri, unspecified: Secondary | ICD-10-CM | POA: Insufficient documentation

## 2019-10-26 NOTE — Procedures (Addendum)
Colposcopy Procedure Note  Pre-operative Diagnosis:  09/2019 pap: LSIL, HPV negative 10/2017 pap: ASCUS/HPV neg  Post-operative Diagnosis: CIN 1  Procedure Details  UPT negative.   The risks (including infection, bleeding, pain) and benefits of the procedure were explained to the patient and written informed consent was obtained.  The patient was placed in the dorsal lithotomy position. A Graves was speculum inserted in the vagina, and the cervix was visualized.  AA staining done Lugol's with green filter.  Biopsy from all four quadrants done and then single toothed tenaculum applied and ECC in all four quadrants done. No bleeding after procedure with application of monsel's  Findings: diffuse AWE changes  Adequate: Yes  Specimens: 2, 4, 8 and 10 o'clock and ECC  Condition: Stable  Complications: None  Plan: The patient was advised to call for any fever or for prolonged or severe pain or bleeding. She was advised to use OTC analgesics as needed for mild to moderate pain. She was advised to avoid vaginal intercourse for 48 hours or until the bleeding has completely stopped.  She says that she's had some qmonth VB and had a hyst in [place] in 2006 due to fibroids. Low transverse skin incision seen. Op note and u/s ordered and RTC for visit after this.    Cornelia Copa MD Attending [facility])

## 2019-10-27 ENCOUNTER — Encounter: Payer: Self-pay | Admitting: Obstetrics and Gynecology

## 2019-10-27 HISTORY — PX: COLPOSCOPY W/ BIOPSY / CURETTAGE: SUR283

## 2019-10-28 LAB — SURGICAL PATHOLOGY

## 2019-10-30 ENCOUNTER — Telehealth (INDEPENDENT_AMBULATORY_CARE_PROVIDER_SITE_OTHER): Payer: Self-pay | Admitting: Lactation Services

## 2019-10-30 DIAGNOSIS — N879 Dysplasia of cervix uteri, unspecified: Secondary | ICD-10-CM

## 2019-10-30 NOTE — Telephone Encounter (Signed)
-----   Message from Harmony Bing, MD sent at 10/30/2019  9:32 AM EST ----- Can you let her know that her colposcopy biopsies showed low grade changes just like her pap smear. Because of this, I recommend a repeat pap smear and hpv test in one year? thanks

## 2019-10-30 NOTE — Telephone Encounter (Signed)
Called pt to inform her of Colposcopy results with recommendation to follow up with the office in 1 year for Pap and HPV testing. Pt voiced understanding.

## 2019-11-02 ENCOUNTER — Ambulatory Visit (HOSPITAL_COMMUNITY)
Admission: RE | Admit: 2019-11-02 | Discharge: 2019-11-02 | Disposition: A | Discharge status: Home / Self Care | Payer: Self-pay | Source: Ambulatory Visit | Attending: Obstetrics and Gynecology | Admitting: Obstetrics and Gynecology

## 2019-11-02 ENCOUNTER — Other Ambulatory Visit: Payer: Self-pay

## 2019-11-02 DIAGNOSIS — N939 Abnormal uterine and vaginal bleeding, unspecified: Secondary | ICD-10-CM | POA: Insufficient documentation

## 2019-11-03 ENCOUNTER — Encounter: Payer: Self-pay | Admitting: General Practice

## 2019-11-03 MED FILL — metFORMIN HCL 500 MG TABS: 500 | 30 days supply | Qty: 120 | Fill #1

## 2019-11-03 MED FILL — LISINOPRIL 40 MG TABLET: 40 | 30 days supply | Qty: 30 | Fill #2

## 2019-11-03 MED FILL — AMLODIPINE BESYLATE 10 MG T: 10 | 30 days supply | Qty: 30 | Fill #2

## 2019-11-03 MED FILL — GLIMEPIRIDE 4 MG TABS: 4 | 30 days supply | Qty: 60 | Fill #1

## 2019-11-03 MED FILL — ?ATORVASTATIN 40MG TABL: 40 | 30 days supply | Qty: 30 | Fill #2

## 2019-11-04 ENCOUNTER — Ambulatory Visit: Payer: Self-pay | Admitting: Nurse Practitioner

## 2019-11-10 ENCOUNTER — Ambulatory Visit: Payer: Self-pay | Admitting: Nurse Practitioner

## 2019-11-18 ENCOUNTER — Ambulatory Visit: Payer: No Typology Code available for payment source | Admitting: Obstetrics and Gynecology

## 2019-11-18 ENCOUNTER — Encounter: Payer: Self-pay | Admitting: Obstetrics and Gynecology

## 2019-11-18 DIAGNOSIS — N83202 Unspecified ovarian cyst, left side: Secondary | ICD-10-CM | POA: Insufficient documentation

## 2019-11-19 ENCOUNTER — Encounter: Payer: Self-pay | Admitting: Obstetrics and Gynecology

## 2019-11-19 NOTE — Progress Notes (Signed)
Patient did not keep her GYN follow up appointment for 11/18/2019.  Cornelia Copa MD [facility]ending [facility] [employer] [facility])

## 2019-11-23 ENCOUNTER — Ambulatory Visit: Payer: No Typology Code available for payment source | Admitting: Nurse Practitioner

## 2020-01-01 ENCOUNTER — Ambulatory Visit: Payer: Self-pay | Attending: Nurse Practitioner | Admitting: Nurse Practitioner

## 2020-01-01 ENCOUNTER — Encounter: Payer: Self-pay | Admitting: Nurse Practitioner

## 2020-01-01 ENCOUNTER — Other Ambulatory Visit: Payer: Self-pay

## 2020-01-01 VITALS — BP 176/96 | HR 103 | Temp 97.7°F | Ht 67.0 in | Wt 200.0 lb

## 2020-01-01 DIAGNOSIS — E118 Type 2 diabetes mellitus with unspecified complications: Secondary | ICD-10-CM

## 2020-01-01 DIAGNOSIS — E1165 Type 2 diabetes mellitus with hyperglycemia: Secondary | ICD-10-CM

## 2020-01-01 DIAGNOSIS — E785 Hyperlipidemia, unspecified: Secondary | ICD-10-CM

## 2020-01-01 DIAGNOSIS — IMO0002 Reserved for concepts with insufficient information to code with codable children: Secondary | ICD-10-CM

## 2020-01-01 DIAGNOSIS — I10 Essential (primary) hypertension: Secondary | ICD-10-CM

## 2020-01-01 LAB — POCT GLYCOSYLATED HEMOGLOBIN (HGB A1C): Hemoglobin A1C: 7.3 % — AB (ref 4.0–5.6)

## 2020-01-01 LAB — GLUCOSE, POCT (MANUAL RESULT ENTRY): POC Glucose: 187 mg/dl — AB (ref 70–99)

## 2020-01-01 MED ORDER — FENOFIBRATE 48 MG PO TABS: 48.0000 mg | ORAL_TABLET | Freq: Every day | ORAL | 2 refills | Status: DC

## 2020-01-01 MED ORDER — TRUE METRIX BLOOD GLUCOSE TEST VI STRP: ORAL_STRIP | 12 refills | Status: DC

## 2020-01-01 MED ORDER — ATORVASTATIN CALCIUM 40 MG PO TABS: 40.0000 mg | ORAL_TABLET | Freq: Every day | ORAL | 0 refills | Status: DC

## 2020-01-01 MED ORDER — TRUEPLUS LANCETS 28G MISC: 3 refills | Status: DC

## 2020-01-01 MED ORDER — AMLODIPINE BESYLATE 10 MG PO TABS: 10.0000 mg | ORAL_TABLET | Freq: Every day | ORAL | 0 refills | Status: DC

## 2020-01-01 MED ORDER — LISINOPRIL 40 MG PO TABS: 40.0000 mg | ORAL_TABLET | Freq: Every day | ORAL | 0 refills | Status: DC

## 2020-01-01 MED ORDER — METFORMIN HCL 500 MG PO TABS: 1000.0000 mg | ORAL_TABLET | Freq: Two times a day (BID) | ORAL | 0 refills | Status: DC

## 2020-01-01 MED ORDER — TRULICITY 0.75 MG/0.5ML ~~LOC~~ SOAJ: 0.7500 mg | SUBCUTANEOUS | 2 refills | Status: DC

## 2020-01-01 MED ORDER — GLIMEPIRIDE 4 MG PO TABS: 8.0000 mg | ORAL_TABLET | Freq: Every day | ORAL | 3 refills | Status: DC

## 2020-01-01 MED FILL — TRULICITY 0.75 MG/0.5 ML PE: 0.75 | 30 days supply | Qty: 2 | Fill #0

## 2020-01-01 MED FILL — GLIMEPIRIDE 4 MG TABS: 4 | 30 days supply | Qty: 60 | Fill #0

## 2020-01-01 MED FILL — TRUEplus LANCETS 28G MISC: 50 days supply | Qty: 100 | Fill #0

## 2020-01-01 MED FILL — ?METFORMIN HCL 500MG TABLET: 500 | 30 days supply | Qty: 120 | Fill #0

## 2020-01-01 MED FILL — TRUE METRIX TEST STRIP: 50 days supply | Qty: 100 | Fill #0

## 2020-01-01 MED FILL — LISINOPRIL 40 MG TABLET: 40 | 30 days supply | Qty: 30 | Fill #0

## 2020-01-01 MED FILL — AMLODIPINE BESYLATE 10 MG T: 10 | 30 days supply | Qty: 30 | Fill #0

## 2020-01-01 MED FILL — ?ATORVASTATIN 40MG TABLET: 40 | 30 days supply | Qty: 30 | Fill #0

## 2020-01-01 MED FILL — FENOFIBRATE 48 MG TABLET: 48 | 30 days supply | Qty: 30 | Fill #0

## 2020-01-01 NOTE — Progress Notes (Signed)
Assessment & Plan:  Phyllis Kemp was seen today for follow-up.  Diagnoses and all orders for this visit:  Diabetes mellitus type 2, uncontrolled, with complications (HCC) -     Glucose (CBG) -     HgB A1c -     CMP14+EGFR -     Dulaglutide (TRULICITY) 6.38 GY/6.5LD SOPN; Inject 0.75 mg into the skin once a week. -     glimepiride (AMARYL) 4 MG tablet; Take 2 tablets (8 mg total) by mouth daily before breakfast. Patient will pick up scripts today. -     glucose blood (TRUE METRIX BLOOD GLUCOSE TEST) test strip; Use as instructed. Check blood glucose level by fingerstick twice per day. Patient will pick up scripts today. -     metFORMIN (GLUCOPHAGE) 500 MG tablet; Take 2 tablets (1,000 mg total) by mouth 2 (two) times daily with a meal. Patient will pick up scripts today. -     TRUEplus Lancets 28G MISC; Use as instructed. Check blood glucose level by fingerstick twice per day. Patient will pick up scripts today.  Hyperlipidemia LDL goal <70 -     Lipid panel -     atorvastatin (LIPITOR) 40 MG tablet; Take 1 tablet (40 mg total) by mouth daily. Patient will pick up scripts today. -     fenofibrate (TRICOR) 48 MG tablet; Take 1 tablet (48 mg total) by mouth daily.  Essential hypertension -     amLODipine (NORVASC) 10 MG tablet; Take 1 tablet (10 mg total) by mouth daily. Patient will pick up scripts today. -     lisinopril (ZESTRIL) 40 MG tablet; Take 1 tablet (40 mg total) by mouth daily.    Patient has been counseled on age-appropriate routine health concerns for screening and prevention. These are reviewed and up-to-date. Referrals have been placed accordingly. Immunizations are up-to-date or declined.    Subjective:   Chief Complaint  Patient presents with  . Follow-up    Pt. is here for a diabetes follow up.    HPI Phyllis Kemp 49 y.o. female presents to office today for follow up.  has a past medical history of Diabetes mellitus type 2 with complications, uncontrolled  ([place]) (2003), Hyperlipidemia, and Hypertension (2003).   She has a history of non adherence. Still drinking alcohol. Several glasses of vodka every night. Grieving the loss of her brother who passed from a car accident last month.   This note is not being shared with the patient for the following reason: To respect privacy (The patient or proxy has requested that the information not be shared).  DM TYPE 2 Improved despite her non adherence. Has eye appointment 4-26 with retinal specialist for "bleeding behind the eyes". Not taking metformin 3570 mg BID, Trulicity 1.77 mg weekly, Amaryl 8 mg daily. On STATIN and ACE per ADA guidelines. She had not been monitoring her blood glucose levels every day.  Lab Results  Component Value Date   HGBA1C 7.3 (A) 01/01/2020   Lab Results  Component Value Date   HGBA1C 9.5 (A) 08/04/2019    Essential Hypertension She does not monitor her blood pressure at home. Non adherent with taking amlodipine 10 mg and lisinopril 40 mg daily. Denies chest pain, shortness of breath, palpitations, lightheadedness, dizziness, headaches or BLE edema.  BP Readings from Last 3 Encounters:  01/01/20 (!) 176/96  10/26/19 (!) 151/84  09/15/19 (!) 157/86   Dyslipidemia LDL at goal. Prescribed atorvastatin 40 mg, fenofibrate 48 mg daily. Denies any statin intolerance.  Lab Results  Component Value Date   LDLCALC 56 08/04/2019    Review of Systems  Constitutional: Negative for fever, malaise/fatigue and weight loss.  HENT: Negative.  Negative for nosebleeds.   Eyes: Negative.  Negative for blurred vision, double vision and photophobia.  Respiratory: Negative.  Negative for cough and shortness of breath.   Cardiovascular: Negative.  Negative for chest pain, palpitations and leg swelling.  Gastrointestinal: Negative.  Negative for heartburn, nausea and vomiting.  Musculoskeletal: Negative.  Negative for myalgias.  Neurological: Negative.  Negative for dizziness, focal  weakness, seizures and headaches.  Psychiatric/Behavioral: Negative.  Negative for suicidal ideas.    Past Medical History:  Diagnosis Date  . Diabetes mellitus type 2 with complications, uncontrolled ([place]) 2003  . Hyperlipidemia   . Hypertension 2003    Past Surgical History:  Procedure Laterality Date  . ABDOMINAL HYSTERECTOMY  2006   partial for fibroid tumors, still has cervix and ovaries   . APPENDECTOMY  1985  . COLPOSCOPY W/ BIOPSY / CURETTAGE  10/27/2019        Family History  Problem Relation Age of Onset  . Diabetes Mother   . Hypertension Mother   . Cancer Maternal Aunt        breast   . Depression Maternal Grandmother     Social History Reviewed with no changes to be made today.   Outpatient Medications Prior to Visit  Medication Sig Dispense Refill  . Blood Glucose Monitoring Suppl (TRUE METRIX METER) w/Device KIT Use as instructed 1 kit 0  . Multiple Vitamin (MULTIVITAMIN) tablet Take 1 tablet by mouth daily.    Marland Kitchen glucose blood (TRUE METRIX BLOOD GLUCOSE TEST) test strip Use as instructed. Check blood glucose level by fingerstick twice per day. Patient will pick up scripts today. 100 each 12  . TRUEplus Lancets 28G MISC Use as instructed. Check blood glucose level by fingerstick twice per day. Patient will pick up scripts today. 100 each 3  . amLODipine (NORVASC) 10 MG tablet Take 1 tablet (10 mg total) by mouth daily. Patient will pick up scripts today. 90 tablet 0  . atorvastatin (LIPITOR) 40 MG tablet Take 1 tablet (40 mg total) by mouth daily. Patient will pick up scripts today. 90 tablet 0  . Dulaglutide (TRULICITY) 0.25 EN/2.7PO SOPN Inject 0.75 mg into the skin once a week. (Patient not taking: Reported on 10/26/2019) 2 mL 2  . fenofibrate (TRICOR) 48 MG tablet Take 1 tablet (48 mg total) by mouth daily. 30 tablet 2  . glimepiride (AMARYL) 4 MG tablet Take 2 tablets (8 mg total) by mouth daily before breakfast. Patient will pick up scripts today. 60 tablet  3  . lisinopril (ZESTRIL) 40 MG tablet Take 1 tablet (40 mg total) by mouth daily. 90 tablet 0  . metFORMIN (GLUCOPHAGE) 500 MG tablet Take 2 tablets (1,000 mg total) by mouth 2 (two) times daily with a meal. Patient will pick up scripts today. 360 tablet 0   No facility-administered medications prior to visit.    No Known Allergies     Objective:    BP (!) 176/96 (BP Location: Left Arm, Patient Position: Sitting, Cuff Size: Normal)   Pulse (!) 103   Temp 97.7 F (36.5 C) (Temporal)   Ht _0  (1.702 m)   Wt 200 lb (90.7 kg)   SpO2 96%   BMI 31.32 kg/m  Wt Readings from Last 3 Encounters:  01/01/20 200 lb (90.7 kg)  10/26/19 198 lb 11.2 oz (  90.1 kg)  09/15/19 202 lb (91.6 kg)    Physical Exam Vitals and nursing note reviewed.  Constitutional:      Appearance: She is well-developed.  HENT:     Head: Normocephalic and atraumatic.  Cardiovascular:     Rate and Rhythm: Regular rhythm. Tachycardia present.     Heart sounds: Normal heart sounds. No murmur. No friction rub. No gallop.   Pulmonary:     Effort: Pulmonary effort is normal. No tachypnea or respiratory distress.     Breath sounds: Normal breath sounds. No decreased breath sounds, wheezing, rhonchi or rales.  Chest:     Chest wall: No tenderness.  Abdominal:     General: Bowel sounds are normal.     Palpations: Abdomen is soft.  Musculoskeletal:        General: Normal range of motion.     Cervical back: Normal range of motion.  Skin:    General: Skin is warm and dry.  Neurological:     Mental Status: She is alert and oriented to person, place, and time.     Coordination: Coordination normal.  Psychiatric:        Behavior: Behavior normal. Behavior is cooperative.        Thought Content: Thought content normal.        Judgment: Judgment normal.          Patient has been counseled extensively about nutrition and exercise as well as the importance of adherence with medications and regular follow-up. The  patient was given clear instructions to go to ER or return to medical center if symptoms don't improve, worsen or new problems develop. The patient verbalized understanding.   Follow-up: Return in about 3 months (around 04/02/2020).   Gildardo Pounds, FNP-BC [facility] [place]   01/01/2020, 10:46 AM

## 2020-01-02 LAB — CMP14+EGFR
ALT: 29 IU/L (ref 0–32)
AST: 27 IU/L (ref 0–40)
Albumin/Globulin Ratio: 1.9 (ref 1.2–2.2)
Albumin: 4.7 g/dL (ref 3.8–4.8)
Alkaline Phosphatase: 58 IU/L (ref 39–117)
BUN/Creatinine Ratio: 15 (ref 9–23)
BUN: 11 mg/dL (ref 6–24)
Bilirubin Total: 0.4 mg/dL (ref 0.0–1.2)
CO2: 21 mmol/L (ref 20–29)
Calcium: 9.6 mg/dL (ref 8.7–10.2)
Chloride: 97 mmol/L (ref 96–106)
Creatinine, Ser: 0.73 mg/dL (ref 0.57–1.00)
GFR calc Af Amer: 113 mL/min/{1.73_m2} (ref >59)
GFR calc non Af Amer: 98 mL/min/{1.73_m2} (ref >59)
Globulin, Total: 2.5 g/dL (ref 1.5–4.5)
Glucose: 193 mg/dL — ABNORMAL HIGH (ref 65–99)
Potassium: 4.4 mmol/L (ref 3.5–5.2)
Sodium: 138 mmol/L (ref 134–144)
Total Protein: 7.2 g/dL (ref 6.0–8.5)

## 2020-01-02 LAB — LIPID PANEL
Chol/HDL Ratio: 2.6 ratio (ref 0.0–4.4)
Cholesterol, Total: 209 mg/dL — ABNORMAL HIGH (ref 100–199)
HDL: 79 mg/dL (ref >39)
LDL Chol Calc (NIH): 61 mg/dL (ref 0–99)
Triglycerides: 461 mg/dL — ABNORMAL HIGH (ref 0–149)
VLDL Cholesterol Cal: 69 mg/dL — ABNORMAL HIGH (ref 5–40)

## 2020-02-01 LAB — HM DIABETES EYE EXAM

## 2020-02-08 MED FILL — AMLODIPINE BESYLATE 10 MG T: 10 | 30 days supply | Qty: 30 | Fill #1

## 2020-02-08 MED FILL — ?ATORVASTATIN 40MG TABLET: 40 | 30 days supply | Qty: 30 | Fill #1

## 2020-02-08 MED FILL — LISINOPRIL 40 MG TABLET: 40 | 30 days supply | Qty: 30 | Fill #1

## 2020-03-22 MED FILL — GLIMEPIRIDE 4 MG TABS: 4 | 30 days supply | Qty: 60 | Fill #1

## 2020-03-22 MED FILL — ?ATORVASTATIN 40MG TABLET: 40 | 30 days supply | Qty: 30 | Fill #2

## 2020-03-22 MED FILL — METFORMIN HCL 500 MG TABS: 500 | 30 days supply | Qty: 120 | Fill #1

## 2020-03-22 MED FILL — LISINOPRIL 40 MG TABLET: 40 | 30 days supply | Qty: 30 | Fill #2

## 2020-03-24 MED FILL — ?AMLODIPINE BESYL 10MG TABL: 10 | 30 days supply | Qty: 30 | Fill #2

## 2020-04-05 ENCOUNTER — Ambulatory Visit: Payer: Self-pay | Attending: Nurse Practitioner | Admitting: Nurse Practitioner

## 2020-04-05 ENCOUNTER — Other Ambulatory Visit: Payer: Self-pay | Admitting: Nurse Practitioner

## 2020-04-05 ENCOUNTER — Other Ambulatory Visit: Payer: Self-pay

## 2020-04-05 ENCOUNTER — Encounter: Payer: Self-pay | Admitting: Nurse Practitioner

## 2020-04-05 DIAGNOSIS — E785 Hyperlipidemia, unspecified: Secondary | ICD-10-CM

## 2020-04-05 DIAGNOSIS — IMO0002 Reserved for concepts with insufficient information to code with codable children: Secondary | ICD-10-CM

## 2020-04-05 DIAGNOSIS — E1165 Type 2 diabetes mellitus with hyperglycemia: Secondary | ICD-10-CM

## 2020-04-05 DIAGNOSIS — I10 Essential (primary) hypertension: Secondary | ICD-10-CM

## 2020-04-05 DIAGNOSIS — E118 Type 2 diabetes mellitus with unspecified complications: Secondary | ICD-10-CM

## 2020-04-05 MED ORDER — TRULICITY 0.75 MG/0.5ML ~~LOC~~ SOAJ: 0.7500 mg | SUBCUTANEOUS | 5 refills | Status: DC

## 2020-04-05 MED ORDER — LISINOPRIL 40 MG PO TABS: 40.0000 mg | ORAL_TABLET | Freq: Every day | ORAL | 1 refills | Status: DC

## 2020-04-05 MED ORDER — AMLODIPINE BESYLATE 10 MG PO TABS: 10.0000 mg | ORAL_TABLET | Freq: Every day | ORAL | 2 refills | Status: DC

## 2020-04-05 MED ORDER — METFORMIN HCL 500 MG PO TABS: 1000.0000 mg | ORAL_TABLET | Freq: Two times a day (BID) | ORAL | 1 refills | Status: DC

## 2020-04-05 MED ORDER — FENOFIBRATE 48 MG PO TABS: 48.0000 mg | ORAL_TABLET | Freq: Every day | ORAL | 2 refills | Status: DC

## 2020-04-05 MED ORDER — TRUE METRIX BLOOD GLUCOSE TEST VI STRP: ORAL_STRIP | 12 refills | Status: DC

## 2020-04-05 MED ORDER — GLIMEPIRIDE 4 MG PO TABS: 8.0000 mg | ORAL_TABLET | Freq: Every day | ORAL | 1 refills | Status: DC

## 2020-04-05 MED ORDER — ATORVASTATIN CALCIUM 40 MG PO TABS: 40.0000 mg | ORAL_TABLET | Freq: Every day | ORAL | 2 refills | Status: DC

## 2020-04-05 MED FILL — $TRULICITY 0.75 MG/0.5 ML P: 0.75 | 120 days supply | Qty: 8 | Fill #0

## 2020-04-05 MED FILL — FENOFIBRATE 48 MG TABLET: 48 | 30 days supply | Qty: 30 | Fill #0

## 2020-04-05 MED FILL — TRUE METRIX TEST STRIP: 50 days supply | Qty: 100 | Fill #0

## 2020-04-05 NOTE — Progress Notes (Signed)
Virtual Visit via Telephone Note Due to national recommendations of social distancing due to COVID 19, telehealth visit is felt to be most appropriate for this patient at this time.  I discussed the limitations, risks, security and privacy concerns of performing an evaluation and management service by telephone and the availability of in person appointments. I also discussed with the patient that there may be a patient responsible charge related to this service. The patient expressed understanding and agreed to proceed.    I connected with Verdis Frederickson on 04/05/20  at   8:30 AM EDT  EDT by telephone and verified that I am speaking with the correct person using two identifiers.   Consent I discussed the limitations, risks, security and privacy concerns of performing an evaluation and management service by telephone and the availability of in person appointments. I also discussed with the patient that there may be a patient responsible charge related to this service. The patient expressed understanding and agreed to proceed.   Location of Patient: Private Residence    Location of Provider: [facility]    Persons participating in Telemedicine visit: Bertram Denver FNP-BC YY Anthon CMA Lang Snow Dierdre Forth    History of Present Illness: Telemedicine visit for: F/U  has a past medical history of Diabetes mellitus type 2 with complications, uncontrolled (HCC) (2003), Hyperlipidemia, and Hypertension (2003).   DM TYPE 2 She has been monitoring her blood glucose levels 1-2 times per day.  States readings are good with average around 140s.  She denies any hypo or hyperglycemic symptoms.  Currently on ACE and statin.  Not consistently medication adherent with taking Trulicity 0.75 mg weekly glimepiride 8 mg daily and Metformin 1000 mg twice daily. LDL at goal.  Lab Results  Component Value Date   HGBA1C 7.3 (A) 01/01/2020   Lab Results  Component Value Date    LDLCALC 61 01/01/2020    Essential Hypertension She does not monitor her blood pressure at home. Not well controlled. She does have a history of non adherence with her blood pressure medications (lisinopril 40 mg and amlodipine 10 mg). ETOH over use.  BP Readings from Last 3 Encounters:  01/01/20 (!) 176/96  10/26/19 (!) 151/84  09/15/19 (!) 157/86    Past Medical History:  Diagnosis Date  . Diabetes mellitus type 2 with complications, uncontrolled (HCC) 2003  . Hyperlipidemia   . Hypertension 2003    Past Surgical History:  Procedure Laterality Date  . ABDOMINAL HYSTERECTOMY  2006   partial for fibroid tumors, still has cervix and ovaries   . APPENDECTOMY  1985  . COLPOSCOPY W/ BIOPSY / CURETTAGE  10/27/2019        Family History  Problem Relation Age of Onset  . Diabetes Mother   . Hypertension Mother   . Cancer Maternal Aunt        breast   . Depression Maternal Grandmother     Social History   Socioeconomic History  . Marital status: Single    Spouse name: Not on file  . Number of children: 2  . Years of education: 12   . Highest education level: Not on file  Occupational History  . Occupation: Homemaker   Tobacco Use  . Smoking status: Never Smoker  . Smokeless tobacco: Never Used  Vaping Use  . Vaping Use: Never used  Substance and Sexual Activity  . Alcohol use: Yes    Alcohol/week: 0.0 standard drinks  . Drug use: No  .  Sexual activity: Yes    Birth control/protection: Surgical  Other Topics Concern  . Not on file  Social History Narrative   Lives alone.   Two adults sons in [place].   No family in [place], moved to be closer to boyfriend.    Social Determinants of Health   Financial Resource Strain:   . Difficulty of Paying Living Expenses:   Food Insecurity:   . Worried About Programme researcher, broadcasting/film/video in the Last Year:   . Barista in the Last Year:   Transportation Needs: Unmet Transportation Needs  . Lack of Transportation  (Medical): Yes  . Lack of Transportation (Non-Medical): Yes  Physical Activity:   . Days of Exercise per Week:   . Minutes of Exercise per Session:   Stress:   . Feeling of Stress :   Social Connections:   . Frequency of Communication with Friends and Family:   . Frequency of Social Gatherings with Friends and Family:   . Attends Religious Services:   . Active Member of Clubs or Organizations:   . Attends Banker Meetings:   Marland Kitchen Marital Status:      Observations/Objective: Awake, alert and oriented x 3   Review of Systems  Constitutional: Negative for fever, malaise/fatigue and weight loss.  HENT: Negative.  Negative for nosebleeds.   Eyes: Negative.  Negative for blurred vision, double vision and photophobia.  Respiratory: Negative.  Negative for cough and shortness of breath.   Cardiovascular: Negative.  Negative for chest pain, palpitations and leg swelling.  Gastrointestinal: Negative.  Negative for heartburn, nausea and vomiting.  Musculoskeletal: Negative.  Negative for myalgias.  Neurological: Negative.  Negative for dizziness, focal weakness, seizures and headaches.  Psychiatric/Behavioral: Positive for depression. Negative for suicidal ideas.    Assessment and Plan: Lesette was seen today for follow-up.  Diagnoses and all orders for this visit:  Diabetes mellitus type 2, uncontrolled, with complications (HCC) -     metFORMIN (GLUCOPHAGE) 500 MG tablet; Take 2 tablets (1,000 mg total) by mouth 2 (two) times daily with a meal. -     glimepiride (AMARYL) 4 MG tablet; Take 2 tablets (8 mg total) by mouth daily before breakfast. Patient will pick up scripts today. -     Dulaglutide (TRULICITY) 0.75 MG/0.5ML SOPN; Inject 0.5 mLs (0.75 mg total) into the skin once a week. -     glucose blood (TRUE METRIX BLOOD GLUCOSE TEST) test strip; Use as instructed. Check blood glucose level by fingerstick twice per day. Patient will pick up scripts today. Continue blood  sugar control as discussed in office today, low carbohydrate diet, and regular physical exercise as tolerated, 150 minutes per week (30 min each day, 5 days per week, or 50 min 3 days per week). Keep blood sugar logs with fasting goal of 90-130 mg/dl, post prandial (after you eat) less than 180.  For Hypoglycemia: BS <60 and Hyperglycemia BS >400; contact the clinic ASAP. Annual eye exams and foot exams are recommended.  Essential hypertension -     lisinopril (ZESTRIL) 40 MG tablet; Take 1 tablet (40 mg total) by mouth daily. -     amLODipine (NORVASC) 10 MG tablet; Take 1 tablet (10 mg total) by mouth daily. Continue all antihypertensives as prescribed.  Remember to bring in your blood pressure log with you for your follow up appointment.  DASH/Mediterranean Diets are healthier choices for HTN.    Hyperlipidemia LDL goal <70 -  fenofibrate (TRICOR) 48 MG tablet; Take 1 tablet (48 mg total) by mouth daily. -     atorvastatin (LIPITOR) 40 MG tablet; Take 1 tablet (40 mg total) by mouth daily. INSTRUCTIONS: Work on a low fat, heart healthy diet and participate in regular aerobic exercise program by working out at least 150 minutes per week; 5 days a week-30 minutes per day. Avoid red meat/beef/steak,  fried foods. junk foods, sodas, sugary drinks, unhealthy snacking, alcohol and smoking.  Drink at least 80 oz of water per day and monitor your carbohydrate intake daily.     Follow Up Instructions Return in about 3 months (around 07/06/2020).     I discussed the assessment and treatment plan with the patient. The patient was provided an opportunity to ask questions and all were answered. The patient agreed with the plan and demonstrated an understanding of the instructions.   The patient was advised to call back or seek an in-person evaluation if the symptoms worsen or if the condition fails to improve as anticipated.  I provided 12 minutes of non-face-to-face time during this encounter  including median intraservice time, reviewing previous notes, labs, imaging, medications and explaining diagnosis and management.  Claiborne Rigg, FNP-BC

## 2020-04-22 ENCOUNTER — Other Ambulatory Visit: Payer: No Typology Code available for payment source

## 2020-05-03 ENCOUNTER — Other Ambulatory Visit: Payer: Self-pay | Admitting: Nurse Practitioner

## 2020-05-03 DIAGNOSIS — I10 Essential (primary) hypertension: Secondary | ICD-10-CM

## 2020-05-03 DIAGNOSIS — IMO0002 Reserved for concepts with insufficient information to code with codable children: Secondary | ICD-10-CM

## 2020-05-03 DIAGNOSIS — E785 Hyperlipidemia, unspecified: Secondary | ICD-10-CM

## 2020-05-03 MED FILL — ?AMLODIPINE BESYL 10MG TABL: 10 | 30 days supply | Qty: 30 | Fill #0

## 2020-05-03 MED FILL — METFORMIN HCL 500 MG TABS: 500 | 30 days supply | Qty: 120 | Fill #0

## 2020-05-03 MED FILL — ATORVASTATIN CALCIUM 40 MG: 40 | 30 days supply | Qty: 30 | Fill #0

## 2020-05-03 MED FILL — LISINOPRIL 40 MG TABLET: 40 | 30 days supply | Qty: 30 | Fill #0

## 2020-05-03 NOTE — Telephone Encounter (Signed)
Pharmacy has script and will get ready for pick up today

## 2020-05-03 NOTE — Telephone Encounter (Signed)
Medication Refill - Medication: lisinopril (ZESTRIL) 40 MG tablet metFORMIN (GLUCOPHAGE) 500 MG tablet amLODipine (NORVASC) 10 MG tablet atorvastatin (LIPITOR) 40 MG tablet    Preferred Pharmacy (with phone number or street name):  [facility] [place], [place] - [address] Phone:  [phone]  Fax:  [phone]       Agent: Please be advised that RX refills may take up to 3 business days. We ask that you follow-up with your pharmacy.

## 2020-06-22 MED FILL — METFORMIN HCL 500 MG TABS: 500 | 30 days supply | Qty: 120 | Fill #1

## 2020-06-22 MED FILL — ATORVASTATIN CALCIUM 40 MG: 40 | 30 days supply | Qty: 30 | Fill #1

## 2020-06-22 MED FILL — AMLODIPINE BESYLATE 10 MG T: 10 | 30 days supply | Qty: 30 | Fill #1

## 2020-06-22 MED FILL — LISINOPRIL 40 MG TABLET: 40 | 30 days supply | Qty: 30 | Fill #1

## 2020-07-22 ENCOUNTER — Ambulatory Visit: Payer: No Typology Code available for payment source | Admitting: Nurse Practitioner

## 2020-08-03 MED FILL — ATORVASTATIN CALCIUM 40 MG: 40 | 30 days supply | Qty: 30 | Fill #2

## 2020-08-03 MED FILL — AMLODIPINE BESYLATE 10 MG T: 10 | 30 days supply | Qty: 30 | Fill #2

## 2020-08-03 MED FILL — LISINOPRIL 40 MG TABLET: 40 | 30 days supply | Qty: 30 | Fill #2

## 2020-08-31 ENCOUNTER — Ambulatory Visit: Payer: No Typology Code available for payment source | Admitting: Nurse Practitioner

## 2020-09-15 MED FILL — AMLODIPINE BESYLATE 10 MG T: 10 | 30 days supply | Qty: 30 | Fill #3

## 2020-09-15 MED FILL — METFORMIN HCL 500 MG TABS: 500 | 30 days supply | Qty: 120 | Fill #2

## 2020-09-15 MED FILL — LISINOPRIL 40 MG TABLET: 40 | 30 days supply | Qty: 30 | Fill #3

## 2020-09-15 MED FILL — GLIMEPIRIDE 4 MG TABS: 4 | 30 days supply | Qty: 60 | Fill #2

## 2020-09-15 MED FILL — ATORVASTATIN CALCIUM 40 MG: 40 | 30 days supply | Qty: 30 | Fill #3

## 2020-09-28 ENCOUNTER — Other Ambulatory Visit: Payer: Self-pay

## 2020-09-28 DIAGNOSIS — Z1231 Encounter for screening mammogram for malignant neoplasm of breast: Secondary | ICD-10-CM

## 2020-10-25 ENCOUNTER — Ambulatory Visit: Payer: Self-pay | Attending: Nurse Practitioner | Admitting: Nurse Practitioner

## 2020-10-25 ENCOUNTER — Other Ambulatory Visit: Payer: Self-pay

## 2020-10-25 ENCOUNTER — Other Ambulatory Visit: Payer: Self-pay | Admitting: Nurse Practitioner

## 2020-10-25 ENCOUNTER — Encounter: Payer: Self-pay | Admitting: Nurse Practitioner

## 2020-10-25 DIAGNOSIS — I10 Essential (primary) hypertension: Secondary | ICD-10-CM

## 2020-10-25 DIAGNOSIS — IMO0002 Reserved for concepts with insufficient information to code with codable children: Secondary | ICD-10-CM

## 2020-10-25 DIAGNOSIS — E1165 Type 2 diabetes mellitus with hyperglycemia: Secondary | ICD-10-CM

## 2020-10-25 DIAGNOSIS — E785 Hyperlipidemia, unspecified: Secondary | ICD-10-CM

## 2020-10-25 DIAGNOSIS — E118 Type 2 diabetes mellitus with unspecified complications: Secondary | ICD-10-CM

## 2020-10-25 MED ORDER — FENOFIBRATE 48 MG PO TABS: 48.0000 mg | ORAL_TABLET | Freq: Every day | ORAL | 2 refills | Status: DC

## 2020-10-25 MED ORDER — TRULICITY 0.75 MG/0.5ML ~~LOC~~ SOAJ: 0.7500 mg | SUBCUTANEOUS | 5 refills | Status: DC

## 2020-10-25 MED ORDER — GLIMEPIRIDE 4 MG PO TABS: 8.0000 mg | ORAL_TABLET | Freq: Every day | ORAL | 1 refills | Status: DC

## 2020-10-25 MED ORDER — TRUEPLUS LANCETS 28G MISC: 3 refills | Status: DC

## 2020-10-25 MED ORDER — AMLODIPINE BESYLATE 10 MG PO TABS: 10.0000 mg | ORAL_TABLET | Freq: Every day | ORAL | 2 refills | Status: DC

## 2020-10-25 MED ORDER — TRUE METRIX METER W/DEVICE KIT: PACK | 0 refills | Status: AC

## 2020-10-25 MED ORDER — ATORVASTATIN CALCIUM 40 MG PO TABS: 40.0000 mg | ORAL_TABLET | Freq: Every day | ORAL | 2 refills | Status: DC

## 2020-10-25 MED ORDER — METFORMIN HCL 500 MG PO TABS: 1000.0000 mg | ORAL_TABLET | Freq: Two times a day (BID) | ORAL | 1 refills | Status: DC

## 2020-10-25 MED ORDER — LISINOPRIL 40 MG PO TABS: 40.0000 mg | ORAL_TABLET | Freq: Every day | ORAL | 1 refills | Status: DC

## 2020-10-25 MED ORDER — TRUE METRIX BLOOD GLUCOSE TEST VI STRP: ORAL_STRIP | 12 refills | Status: DC

## 2020-10-25 MED FILL — $TRULICITY 0.75 MG/0.5 ML P: 0.75 | 28 days supply | Qty: 2 | Fill #0

## 2020-10-25 MED FILL — AMLODIPINE BESYLATE 10 MG T: 10 | 30 days supply | Qty: 30 | Fill #0

## 2020-10-25 MED FILL — FENOFIBRATE 48 MG TABLET: 48 | 30 days supply | Qty: 30 | Fill #0

## 2020-10-25 MED FILL — TRUE METRIX GLUCOSE TEST ST: 50 days supply | Qty: 100 | Fill #0

## 2020-10-25 MED FILL — TRUEplus LANCETS 28G MISC: 50 days supply | Qty: 100 | Fill #0

## 2020-10-25 MED FILL — !TRUE METRIX BLOOD GLUCOSE: 1 days supply | Qty: 1 | Fill #0

## 2020-10-25 MED FILL — METFORMIN HCL 500 MG TABS: 500 | 30 days supply | Qty: 120 | Fill #0

## 2020-10-25 MED FILL — LISINOPRIL 40 MG TABLET: 40 | 30 days supply | Qty: 30 | Fill #0

## 2020-10-25 MED FILL — ATORVASTATIN CALCIUM 40 MG: 40 | 30 days supply | Qty: 30 | Fill #0

## 2020-10-25 MED FILL — GLIMEPIRIDE 4 MG TABS: 4 | 30 days supply | Qty: 60 | Fill #0

## 2020-10-25 NOTE — Progress Notes (Signed)
Virtual Visit via Telephone Note Due to national recommendations of social distancing due to [place] 19, telehealth visit is felt to be most appropriate for this patient at this time.  I discussed the limitations, risks, security and privacy concerns of performing an evaluation and management service by telephone and the availability of in person appointments. I also discussed with the patient that there may be a patient responsible charge related to this service. The patient expressed understanding and agreed to proceed.    I connected with Phyllis Kemp on 10/25/20  at  10:30 AM EST  EDT by telephone and verified that I am speaking with the correct person using two identifiers.   Consent I discussed the limitations, risks, security and privacy concerns of performing an evaluation and management service by telephone and the availability of in person appointments. I also discussed with the patient that there may be a patient responsible charge related to this service. The patient expressed understanding and agreed to proceed.   Location of Patient: Private  Residence    Location of Provider: [place] and [employer] Office    Persons participating in Telemedicine visit: Geryl Rankins FNP-BC [place]    History of Present Illness: Telemedicine visit for: Follow Up  has a past medical history of Diabetes mellitus type 2 with complications, uncontrolled ([place]) (2003), Hyperlipidemia, and Hypertension (2003).   Found out she had COVID last week. Currently with symptoms of body aches. Taking mucinex and ibuprofen.   Essential Hypertension Not well controlled. Most of her blood pressure medications have expired. She has a history of non adherence with taking her medications. She has been prescribed amlodipine 10 mg, lisinopril 40 mg. Denies chest pain, shortness of breath, palpitations, lightheadedness, dizziness, headaches or BLE edema.  BP Readings  from Last 3 Encounters:  01/01/20 (!) 176/96  10/26/19 (!) 151/84  09/15/19 (!) 157/86   DM 2 Monitoring her blood glucose infrequently .Reports last reading was "good". Currently prescribed glimepiride 8 mg daily, metformin 0383 mg BID, Trulicty 3.38VA weekly. LDL at goal with atorvastatin 40 mg daily and tricor 48 mg daily. Denies any statin intolerance.  Lab Results  Component Value Date   HGBA1C 7.3 (A) 01/01/2020   Lab Results  Component Value Date   LDLCALC 61 01/01/2020      Past Medical History:  Diagnosis Date  . Diabetes mellitus type 2 with complications, uncontrolled ([place]) 2003  . Hyperlipidemia   . Hypertension 2003    Past Surgical History:  Procedure Laterality Date  . ABDOMINAL HYSTERECTOMY  2006   partial for fibroid tumors, still has cervix and ovaries   . APPENDECTOMY  1985  . COLPOSCOPY W/ BIOPSY / CURETTAGE  10/27/2019        Family History  Problem Relation Age of Onset  . Diabetes Mother   . Hypertension Mother   . Cancer Maternal Aunt        breast   . Depression Maternal Grandmother     Social History   Socioeconomic History  . Marital status: Single    Spouse name: Not on file  . Number of children: 2  . Years of education: 64   . Highest education level: Not on file  Occupational History  . Occupation: Homemaker   Tobacco Use  . Smoking status: Never Smoker  . Smokeless tobacco: Never Used  Vaping Use  . Vaping Use: Never used  Substance and Sexual Activity  . Alcohol use: Yes  Alcohol/week: 0.0 standard drinks  . Drug use: No  . Sexual activity: Yes    Birth control/protection: Surgical  Other Topics Concern  . Not on file  Social History Narrative   Lives alone.   Two adults sons in [place].   No family in [place], moved to be closer to boyfriend.    Social Determinants of Health   Financial Resource Strain: Not on file  Food Insecurity: Not on file  Transportation Needs: Not on file  Physical Activity: Not  on file  Stress: Not on file  Social Connections: Not on file     Observations/Objective: Awake, alert and oriented x 3   Review of Systems  Constitutional: Negative for fever, malaise/fatigue and weight loss.  HENT: Negative.  Negative for nosebleeds.   Eyes: Negative.  Negative for blurred vision, double vision and photophobia.  Respiratory: Negative.  Negative for cough and shortness of breath.   Cardiovascular: Negative.  Negative for chest pain, palpitations and leg swelling.  Gastrointestinal: Negative.  Negative for heartburn, nausea and vomiting.  Musculoskeletal: Negative.  Negative for myalgias.  Neurological: Negative.  Negative for dizziness, focal weakness, seizures and headaches.  Psychiatric/Behavioral: Negative.  Negative for suicidal ideas.    Assessment and Plan: Lelaina was seen today for follow-up.  Diagnoses and all orders for this visit:  Essential hypertension -     lisinopril (ZESTRIL) 40 MG tablet; Take 1 tablet (40 mg total) by mouth daily. -     amLODipine (NORVASC) 10 MG tablet; Take 1 tablet (10 mg total) by mouth daily. Continue all antihypertensives as prescribed.  Remember to bring in your blood pressure log with you for your follow up appointment.  DASH/Mediterranean Diets are healthier choices for HTN.    Diabetes mellitus type 2, uncontrolled, with complications ([place]) -     TRUEplus Lancets 28G MISC; Use as instructed. Check blood glucose level by fingerstick twice per day. Patient will pick up scripts today. -     glucose blood (TRUE METRIX BLOOD GLUCOSE TEST) test strip; Use as instructed. Check blood glucose level by fingerstick twice per day. Patient will pick up scripts today. -     glimepiride (AMARYL) 4 MG tablet; Take 2 tablets (8 mg total) by mouth daily before breakfast. Patient will pick up scripts today. -     Blood Glucose Monitoring Suppl (TRUE METRIX METER) w/Device KIT; Use as instructed -     Dulaglutide (TRULICITY) 7.86  VE/7.2CN SOPN; Inject 0.75 mg into the skin once a week. -     metFORMIN (GLUCOPHAGE) 500 MG tablet; Take 2 tablets (1,000 mg total) by mouth 2 (two) times daily with a meal. Continue blood sugar control as discussed in office today, low carbohydrate diet, and regular physical exercise as tolerated, 150 minutes per week (30 min each day, 5 days per week, or 50 min 3 days per week). Keep blood sugar logs with fasting goal of 90-130 mg/dl, post prandial (after you eat) less than 180.  For Hypoglycemia: BS <60 and Hyperglycemia BS >400; contact the clinic ASAP. Annual eye exams and foot exams are recommended.   Hyperlipidemia LDL goal <70 -     fenofibrate (TRICOR) 48 MG tablet; Take 1 tablet (48 mg total) by mouth daily. -     atorvastatin (LIPITOR) 40 MG tablet; Take 1 tablet (40 mg total) by mouth daily. INSTRUCTIONS: Work on a low fat, heart healthy diet and participate in regular aerobic exercise program by working out at least 150 minutes  per week; 5 days a week-30 minutes per day. Avoid red meat/beef/steak,  fried foods. junk foods, sodas, sugary drinks, unhealthy snacking, alcohol and smoking.  Drink at least 80 oz of water per day and monitor your carbohydrate intake daily.     Follow Up Instructions Return in about 3 months (around 01/23/2021).     I discussed the assessment and treatment plan with the patient. The patient was provided an opportunity to ask questions and all were answered. The patient agreed with the plan and demonstrated an understanding of the instructions.   The patient was advised to call back or seek an in-person evaluation if the symptoms worsen or if the condition fails to improve as anticipated.  I provided 14 minutes of non-face-to-face time during this encounter including median intraservice time, reviewing previous notes, labs, imaging, medications and explaining diagnosis and management.  Gildardo Pounds, FNP-BC

## 2020-11-02 MED FILL — METFORMIN HCL 500 MG TABS: 500 | 30 days supply | Qty: 120 | Fill #0

## 2020-11-02 MED FILL — FENOFIBRATE 48 MG TABLET: 48 | 30 days supply | Qty: 30 | Fill #0

## 2020-11-02 MED FILL — ATORVASTATIN CALCIUM 40 MG: 40 | 30 days supply | Qty: 30 | Fill #0

## 2020-11-02 MED FILL — LISINOPRIL 40 MG TABLET: 40 | 30 days supply | Qty: 30 | Fill #0

## 2020-11-02 MED FILL — GLIMEPIRIDE 4 MG TABS: 4 | 30 days supply | Qty: 60 | Fill #0

## 2020-11-02 MED FILL — AMLODIPINE BESYLATE 10 MG T: 10 | 30 days supply | Qty: 30 | Fill #0

## 2020-11-10 ENCOUNTER — Ambulatory Visit: Payer: Self-pay

## 2020-11-21 ENCOUNTER — Other Ambulatory Visit: Payer: Self-pay

## 2020-11-21 ENCOUNTER — Ambulatory Visit: Payer: Self-pay | Attending: Nurse Practitioner

## 2020-11-21 DIAGNOSIS — I10 Essential (primary) hypertension: Secondary | ICD-10-CM

## 2020-11-21 DIAGNOSIS — IMO0002 Reserved for concepts with insufficient information to code with codable children: Secondary | ICD-10-CM

## 2020-11-21 DIAGNOSIS — Z1159 Encounter for screening for other viral diseases: Secondary | ICD-10-CM

## 2020-11-21 DIAGNOSIS — Z1211 Encounter for screening for malignant neoplasm of colon: Secondary | ICD-10-CM

## 2020-11-21 DIAGNOSIS — E1165 Type 2 diabetes mellitus with hyperglycemia: Secondary | ICD-10-CM

## 2020-11-21 DIAGNOSIS — E785 Hyperlipidemia, unspecified: Secondary | ICD-10-CM

## 2020-11-22 LAB — LIPID PANEL
Chol/HDL Ratio: 2 ratio (ref 0.0–4.4)
Cholesterol, Total: 167 mg/dL (ref 100–199)
HDL: 82 mg/dL (ref >39)
LDL Chol Calc (NIH): 50 mg/dL (ref 0–99)
Triglycerides: 225 mg/dL — ABNORMAL HIGH (ref 0–149)
VLDL Cholesterol Cal: 35 mg/dL (ref 5–40)

## 2020-11-22 LAB — CMP14+EGFR
ALT: 37 IU/L — ABNORMAL HIGH (ref 0–32)
AST: 33 IU/L (ref 0–40)
Albumin/Globulin Ratio: 1.8 (ref 1.2–2.2)
Albumin: 4.8 g/dL (ref 3.8–4.8)
Alkaline Phosphatase: 56 IU/L (ref 44–121)
BUN/Creatinine Ratio: 18 (ref 9–23)
BUN: 13 mg/dL (ref 6–24)
Bilirubin Total: 0.6 mg/dL (ref 0.0–1.2)
CO2: 23 mmol/L (ref 20–29)
Calcium: 9.9 mg/dL (ref 8.7–10.2)
Chloride: 100 mmol/L (ref 96–106)
Creatinine, Ser: 0.73 mg/dL (ref 0.57–1.00)
GFR calc Af Amer: 112 mL/min/{1.73_m2} (ref >59)
GFR calc non Af Amer: 97 mL/min/{1.73_m2} (ref >59)
Globulin, Total: 2.7 g/dL (ref 1.5–4.5)
Glucose: 264 mg/dL — ABNORMAL HIGH (ref 65–99)
Potassium: 4.9 mmol/L (ref 3.5–5.2)
Sodium: 142 mmol/L (ref 134–144)
Total Protein: 7.5 g/dL (ref 6.0–8.5)

## 2020-11-22 LAB — CBC
Hematocrit: 39.3 % (ref 34.0–46.6)
Hemoglobin: 12.8 g/dL (ref 11.1–15.9)
MCH: 28.1 pg (ref 26.6–33.0)
MCHC: 32.6 g/dL (ref 31.5–35.7)
MCV: 86 fL (ref 79–97)
Platelets: 239 10*3/uL (ref 150–450)
RBC: 4.56 x10E6/uL (ref 3.77–5.28)
RDW: 12.7 % (ref 11.7–15.4)
WBC: 4.4 10*3/uL (ref 3.4–10.8)

## 2020-11-22 LAB — HEMOGLOBIN A1C
Est. average glucose Bld gHb Est-mCnc: 214 mg/dL
Hgb A1c MFr Bld: 9.1 % — ABNORMAL HIGH (ref 4.8–5.6)

## 2020-11-22 LAB — HCV INTERPRETATION

## 2020-11-22 LAB — HCV AB W REFLEX TO QUANT PCR: HCV Ab: 0.1 s/co ratio (ref 0.0–0.9)

## 2020-11-28 ENCOUNTER — Other Ambulatory Visit: Payer: Self-pay | Admitting: Nurse Practitioner

## 2020-11-28 DIAGNOSIS — IMO0002 Reserved for concepts with insufficient information to code with codable children: Secondary | ICD-10-CM

## 2020-11-28 DIAGNOSIS — E1165 Type 2 diabetes mellitus with hyperglycemia: Secondary | ICD-10-CM

## 2020-11-28 MED ORDER — TRULICITY 1.5 MG/0.5ML ~~LOC~~ SOAJ: 1.5000 mg | SUBCUTANEOUS | 1 refills | Status: DC

## 2020-12-07 ENCOUNTER — Ambulatory Visit: Payer: Self-pay | Admitting: Nurse Practitioner

## 2020-12-14 MED FILL — METFORMIN HCL 500 MG TABS: 500 | 30 days supply | Qty: 120 | Fill #3

## 2020-12-14 MED FILL — AMLODIPINE BESYLATE 10 MG T: 10 | 30 days supply | Qty: 30 | Fill #1

## 2020-12-14 MED FILL — LISINOPRIL 40 MG TAB: 40 | 30 days supply | Qty: 30 | Fill #1

## 2021-01-07 ENCOUNTER — Other Ambulatory Visit: Payer: Self-pay

## 2021-02-08 ENCOUNTER — Other Ambulatory Visit: Payer: Self-pay

## 2021-02-08 MED FILL — Amlodipine Besylate Tab 10 MG (Base Equivalent): ORAL | 30 days supply | Qty: 30 | Fill #0 | Status: AC

## 2021-02-08 MED FILL — Lisinopril Tab 40 MG: ORAL | 30 days supply | Qty: 30 | Fill #0 | Status: AC

## 2021-03-10 ENCOUNTER — Other Ambulatory Visit: Payer: Self-pay

## 2021-03-10 MED FILL — Lisinopril Tab 40 MG: ORAL | 30 days supply | Qty: 30 | Fill #1 | Status: AC

## 2021-03-10 MED FILL — Amlodipine Besylate Tab 10 MG (Base Equivalent): ORAL | 30 days supply | Qty: 30 | Fill #1 | Status: AC

## 2021-03-10 MED FILL — Fenofibrate Tab 48 MG: ORAL | 30 days supply | Qty: 30 | Fill #0 | Status: AC

## 2021-03-10 MED FILL — Atorvastatin Calcium Tab 40 MG (Base Equivalent): ORAL | 30 days supply | Qty: 30 | Fill #0 | Status: AC

## 2021-03-14 ENCOUNTER — Other Ambulatory Visit: Payer: Self-pay

## 2021-04-17 ENCOUNTER — Ambulatory Visit: Payer: Self-pay | Admitting: Physical Therapy

## 2021-04-17 ENCOUNTER — Ambulatory Visit: Payer: Self-pay | Admitting: Occupational Therapy

## 2021-05-05 ENCOUNTER — Other Ambulatory Visit: Payer: Self-pay

## 2021-05-05 MED FILL — Fenofibrate Tab 48 MG: ORAL | 30 days supply | Qty: 30 | Fill #1 | Status: AC

## 2021-05-05 MED FILL — Lisinopril Tab 40 MG: ORAL | 30 days supply | Qty: 30 | Fill #2 | Status: AC

## 2021-05-05 MED FILL — Amlodipine Besylate Tab 10 MG (Base Equivalent): ORAL | 30 days supply | Qty: 30 | Fill #2 | Status: AC

## 2021-06-14 ENCOUNTER — Other Ambulatory Visit: Payer: Self-pay

## 2021-06-14 MED FILL — Lisinopril Tab 40 MG: ORAL | 30 days supply | Qty: 30 | Fill #3 | Status: AC

## 2021-06-14 MED FILL — Amlodipine Besylate Tab 10 MG (Base Equivalent): ORAL | 30 days supply | Qty: 30 | Fill #3 | Status: AC

## 2021-06-14 MED FILL — Fenofibrate Tab 48 MG: ORAL | 30 days supply | Qty: 30 | Fill #2 | Status: AC

## 2021-06-16 ENCOUNTER — Other Ambulatory Visit: Payer: Self-pay

## 2021-08-03 ENCOUNTER — Other Ambulatory Visit: Payer: Self-pay | Admitting: Nurse Practitioner

## 2021-08-03 ENCOUNTER — Other Ambulatory Visit: Payer: Self-pay

## 2021-08-03 DIAGNOSIS — I10 Essential (primary) hypertension: Secondary | ICD-10-CM

## 2021-08-03 MED FILL — Fenofibrate Tab 48 MG: ORAL | 30 days supply | Qty: 30 | Fill #3 | Status: AC

## 2021-08-03 MED FILL — Metformin HCl Tab 500 MG: ORAL | 30 days supply | Qty: 120 | Fill #0 | Status: AC

## 2021-08-03 MED FILL — Amlodipine Besylate Tab 10 MG (Base Equivalent): ORAL | 30 days supply | Qty: 30 | Fill #4 | Status: AC

## 2021-08-03 NOTE — Telephone Encounter (Signed)
Requested medications are due for refill today yes  Requested medications are on the active medication list yes  Last refill 06/16/21  Last visit 10/25/20  Future visit scheduled 08/09/21  Notes to clinic Failed protocol due to no valid visit and labs within 6  months, appt scheduled for 08/09/21, please assess.  Requested Prescriptions  Pending Prescriptions Disp Refills   lisinopril (ZESTRIL) 40 MG tablet 90 tablet 1    Sig: TAKE 1 TABLET (40 MG TOTAL) BY MOUTH DAILY.     Cardiovascular:  ACE Inhibitors Failed - 08/03/2021 10:34 AM      Failed - Cr in normal range and within 180 days    Creat  Date Value Ref Range Status  10/18/2014 0.78 0.50 - 1.10 mg/dL Final   Creatinine, Ser  Date Value Ref Range Status  11/21/2020 0.73 0.57 - 1.00 mg/dL Final   Creatinine, Urine  Date Value Ref Range Status  10/18/2014 240.5 mg/dL Final    Comment:    No reference range established.          Failed - K in normal range and within 180 days    Potassium  Date Value Ref Range Status  11/21/2020 4.9 3.5 - 5.2 mmol/L Final          Failed - Last BP in normal range    BP Readings from Last 1 Encounters:  01/01/20 (!) 176/96          Failed - Valid encounter within last 6 months    Recent Outpatient Visits           9 months ago Essential hypertension   [place] [facility] [place], [place] W, NP   1 year ago Diabetes mellitus type 2, uncontrolled, with complications [facility])   [place] [facility] [place], [place]a Stakes, NP   1 year ago Diabetes mellitus type 2, uncontrolled, with complications [facility])   [place] [facility] [place], [place] W, NP   2 years ago Diabetes mellitus type 2, uncontrolled, with complications [facility])   [place] [facility] [place], [place] W, NP   2 years ago Diabetes mellitus type 2, uncontrolled, with complications [facility])    [facility] [place], Cornelius Moras, RPH-CPP       Future Appointments             In 6 days Claiborne Rigg, NP [facility]            Passed - Patient is not pregnant

## 2021-08-04 ENCOUNTER — Other Ambulatory Visit: Payer: Self-pay

## 2021-08-04 MED ORDER — LISINOPRIL 40 MG PO TABS
ORAL_TABLET | Freq: Every day | ORAL | 0 refills | Status: DC
  Filled 2021-08-04: qty 30, 30d supply, fill #0

## 2021-08-09 ENCOUNTER — Ambulatory Visit: Payer: Self-pay | Attending: Nurse Practitioner | Admitting: Nurse Practitioner

## 2021-08-09 ENCOUNTER — Encounter: Payer: Self-pay | Admitting: Nurse Practitioner

## 2021-08-09 ENCOUNTER — Other Ambulatory Visit: Payer: Self-pay

## 2021-08-09 VITALS — BP 146/89 | HR 99 | Ht 67.0 in | Wt 188.4 lb

## 2021-08-09 DIAGNOSIS — E1165 Type 2 diabetes mellitus with hyperglycemia: Secondary | ICD-10-CM

## 2021-08-09 DIAGNOSIS — I10 Essential (primary) hypertension: Secondary | ICD-10-CM

## 2021-08-09 DIAGNOSIS — E1142 Type 2 diabetes mellitus with diabetic polyneuropathy: Secondary | ICD-10-CM

## 2021-08-09 DIAGNOSIS — E785 Hyperlipidemia, unspecified: Secondary | ICD-10-CM

## 2021-08-09 LAB — POCT GLYCOSYLATED HEMOGLOBIN (HGB A1C): Hemoglobin A1C: 13.5 % — AB (ref 4.0–5.6)

## 2021-08-09 LAB — GLUCOSE, POCT (MANUAL RESULT ENTRY): POC Glucose: 341 mg/dl — AB (ref 70–99)

## 2021-08-09 MED ORDER — GLIMEPIRIDE 4 MG PO TABS
ORAL_TABLET | ORAL | 1 refills | Status: DC
  Filled 2021-08-09: qty 60, 30d supply, fill #0

## 2021-08-09 MED ORDER — FENOFIBRATE 48 MG PO TABS
ORAL_TABLET | ORAL | 2 refills | Status: DC
  Filled 2021-08-09: qty 90, fill #0

## 2021-08-09 MED ORDER — ATORVASTATIN CALCIUM 40 MG PO TABS
ORAL_TABLET | Freq: Every day | ORAL | 2 refills | Status: DC
  Filled 2021-08-09: qty 30, 30d supply, fill #0

## 2021-08-09 MED ORDER — LISINOPRIL 40 MG PO TABS
40.0000 mg | ORAL_TABLET | Freq: Every day | ORAL | 1 refills | Status: DC
  Filled 2021-08-09: qty 90, 90d supply, fill #0
  Filled 2021-09-19: qty 30, 30d supply, fill #0
  Filled 2021-10-27: qty 30, 30d supply, fill #1
  Filled 2021-10-27: qty 30, 30d supply, fill #0
  Filled 2021-12-04 – 2021-12-11 (×2): qty 30, 30d supply, fill #1

## 2021-08-09 MED ORDER — AMLODIPINE BESYLATE 10 MG PO TABS
ORAL_TABLET | Freq: Every day | ORAL | 2 refills | Status: DC
  Filled 2021-08-09: qty 90, fill #0
  Filled 2021-09-19: qty 30, 30d supply, fill #0
  Filled 2021-10-27: qty 30, 30d supply, fill #1
  Filled 2021-10-27: qty 30, 30d supply, fill #0
  Filled 2021-12-04 – 2021-12-11 (×2): qty 30, 30d supply, fill #1

## 2021-08-09 MED ORDER — METFORMIN HCL 500 MG PO TABS
ORAL_TABLET | Freq: Two times a day (BID) | ORAL | 1 refills | Status: DC
  Filled 2021-08-09: qty 360, fill #0
  Filled 2021-12-04: qty 120, 30d supply, fill #0
  Filled 2021-12-04: qty 360, fill #0
  Filled 2021-12-11: qty 120, 30d supply, fill #0

## 2021-08-09 MED ORDER — GABAPENTIN 300 MG PO CAPS
300.0000 mg | ORAL_CAPSULE | Freq: Three times a day (TID) | ORAL | 3 refills | Status: DC
  Filled 2021-08-09: qty 90, 30d supply, fill #0

## 2021-08-09 MED ORDER — DULAGLUTIDE 1.5 MG/0.5ML ~~LOC~~ SOAJ
1.5000 mg | SUBCUTANEOUS | 1 refills | Status: DC
  Filled 2021-08-09: qty 2, 28d supply, fill #0

## 2021-08-09 MED ORDER — LANTUS SOLOSTAR 100 UNIT/ML ~~LOC~~ SOPN
15.0000 [IU] | PEN_INJECTOR | Freq: Every day | SUBCUTANEOUS | 99 refills | Status: DC
  Filled 2021-08-09: qty 3, 20d supply, fill #0
  Filled 2021-10-06: qty 3, 20d supply, fill #1
  Filled 2021-10-12: qty 3, 20d supply, fill #0
  Filled 2021-12-04 – 2021-12-11 (×2): qty 3, 20d supply, fill #1

## 2021-08-09 NOTE — Progress Notes (Signed)
Assessment & Plan:  [name] was seen today for diabetes and hypertension.  Diagnoses and all orders for this visit:  Type 2 diabetes mellitus with hyperglycemia, without long-term current use of insulin (HCC) -     POCT glucose (manual entry) -     POCT glycosylated hemoglobin (Hb A1C) -     insulin glargine (LANTUS SOLOSTAR) 100 UNIT/ML Solostar Pen; Inject 15 Units into the skin daily. -     Dulaglutide 1.5 MG/0.5ML SOPN; INJECT 1.5 MG INTO THE SKIN ONCE A WEEK. -     glimepiride (AMARYL) 4 MG tablet; TAKE 2 TABLETS (8 MG TOTAL) BY MOUTH DAILY BEFORE BREAKFAST. -     metFORMIN (GLUCOPHAGE) 500 MG tablet; TAKE 2 TABLETS (1,000 MG TOTAL) BY MOUTH 2 (TWO) TIMES DAILY WITH A MEAL. -     CMP14+EGFR  Hyperlipidemia LDL goal <70 -     atorvastatin (LIPITOR) 40 MG tablet; TAKE 1 TABLET (40 MG TOTAL) BY MOUTH DAILY. -     fenofibrate (TRICOR) 48 MG tablet; TAKE 1 TABLET (48 MG TOTAL) BY MOUTH DAILY.  Essential hypertension -     amLODipine (NORVASC) 10 MG tablet; TAKE 1 TABLET (10 MG TOTAL) BY MOUTH DAILY. -     lisinopril (ZESTRIL) 40 MG tablet; Take 1 tablet (40 mg total) by mouth daily. -     CMP14+EGFR  Diabetic polyneuropathy associated with type 2 diabetes mellitus (HCC) -     gabapentin (NEURONTIN) 300 MG capsule; Take 1 capsule (300 mg total) by mouth 3 (three) times daily.   Patient has been counseled on age-appropriate routine health concerns for screening and prevention. These are reviewed and up-to-date. Referrals have been placed accordingly. Immunizations are up-to-date or declined.    Subjective:   Chief Complaint  Patient presents with   Diabetes   Hypertension   HPI Phyllis Kemp 50 y.o. female presents to office today for follow up to DM and HTN She has a past medical history of Diabetes mellitus type 2 with complications, uncontrolled (2003), peripheral neuropathy, Hyperlipidemia, and Hypertension (2003).   DM  Poorly controlled due to dietary and  medication nonadherence.  She does endorse increased stress with the passing of her brother.  Most recently her friend diagnosed with colon cancer.  She is also self-medicating her stress with alcohol.  She has not been taking her Trulicity but endorses medication adherence with taking metformin 1000 mg twice daily and glimepiride 8 mg daily.  Hyperglycemic symptoms complications include diabetic peripheral neuropathy in both feet.  She has been instructed to start her Trulicity and today I am also initiating Lantus 15 units nightly.  LDL at goal with atorvastatin 40 mg daily Lab Results  Component Value Date   HGBA1C 13.5 (A) 08/09/2021    Lab Results  Component Value Date   LDLCALC 50 11/21/2020    HYPERTENSION Blood pressure is not controlled.  She endorses being without medications for several days.  Currently prescribed amlodipine 10 mg daily and lisinopril 40 mg daily. BP Readings from Last 3 Encounters:  08/09/21 (!) 146/89  01/01/20 (!) 176/96  10/26/19 (!) 151/84     Review of Systems  Constitutional:  Negative for fever, malaise/fatigue and weight loss.  HENT: Negative.  Negative for nosebleeds.   Eyes: Negative.  Negative for blurred vision, double vision and photophobia.  Respiratory: Negative.  Negative for cough and shortness of breath.   Cardiovascular: Negative.  Negative for chest pain, palpitations and leg swelling.  Gastrointestinal: Negative.  Negative for heartburn, nausea and vomiting.  Musculoskeletal: Negative.  Negative for myalgias.  Neurological:  Positive for sensory change. Negative for dizziness, focal weakness, seizures and headaches.  Psychiatric/Behavioral:  Positive for substance abuse (ETOH). Negative for suicidal ideas.    Past Medical History:  Diagnosis Date   Diabetes mellitus type 2 with complications, uncontrolled 2003   Hyperlipidemia    Hypertension 2003    Past Surgical History:  Procedure Laterality Date   ABDOMINAL HYSTERECTOMY  2006    partial for fibroid tumors, still has cervix and ovaries    APPENDECTOMY  1985   COLPOSCOPY W/ BIOPSY / CURETTAGE  10/27/2019        Family History  Problem Relation Age of Onset   Diabetes Mother    Hypertension Mother    Cancer Maternal Aunt        breast    Depression Maternal Grandmother     Social History Reviewed with no changes to be made today.   Outpatient Medications Prior to Visit  Medication Sig Dispense Refill   Blood Glucose Monitoring Suppl (TRUE METRIX METER) w/Device KIT Use as instructed 1 kit 0   glucose blood test strip USE AS INSTRUCTED. CHECK BLOOD GLUCOSE LEVEL BY FINGERSTICK TWICE PER DAY. 100 strip 12   Multiple Vitamin (MULTIVITAMIN) tablet Take 1 tablet by mouth daily.     TRUEplus Lancets 28G MISC USE AS INSTRUCTED. CHECK BLOOD GLUCOSE LEVEL BY FINGERSTICK TWICE PER DAY. 100 each 3   amLODipine (NORVASC) 10 MG tablet TAKE 1 TABLET (10 MG TOTAL) BY MOUTH DAILY. 90 tablet 2   atorvastatin (LIPITOR) 40 MG tablet TAKE 1 TABLET (40 MG TOTAL) BY MOUTH DAILY. 90 tablet 2   fenofibrate (TRICOR) 48 MG tablet TAKE 1 TABLET (48 MG TOTAL) BY MOUTH DAILY. 90 tablet 2   glimepiride (AMARYL) 4 MG tablet TAKE 2 TABLETS (8 MG TOTAL) BY MOUTH DAILY BEFORE BREAKFAST. PATIENT WILL PICK UP SCRIPTS TODAY. 180 tablet 1   lisinopril (ZESTRIL) 40 MG tablet TAKE 1 TABLET (40 MG TOTAL) BY MOUTH DAILY. 30 tablet 0   metFORMIN (GLUCOPHAGE) 500 MG tablet TAKE 2 TABLETS (1,000 MG TOTAL) BY MOUTH 2 (TWO) TIMES DAILY WITH A MEAL. 360 tablet 1   Dulaglutide 1.5 MG/0.5ML SOPN INJECT 1.5 MG INTO THE SKIN ONCE A WEEK. (Patient not taking: Reported on 08/09/2021) 2 mL 1   No facility-administered medications prior to visit.    No Known Allergies     Objective:    BP (!) 146/89   Pulse 99   Ht 5' 7" (1.702 m)   Wt 188 lb 6 oz (85.4 kg)   SpO2 97%   BMI 29.50 kg/m  Wt Readings from Last 3 Encounters:  08/09/21 188 lb 6 oz (85.4 kg)  01/01/20 200 lb (90.7 kg)  10/26/19 198 lb  11.2 oz (90.1 kg)    Physical Exam Vitals and nursing note reviewed.  Constitutional:      Appearance: She is well-developed.  HENT:     Head: Normocephalic and atraumatic.  Cardiovascular:     Rate and Rhythm: Normal rate and regular rhythm.     Heart sounds: Normal heart sounds. No murmur heard.   No friction rub. No gallop.  Pulmonary:     Effort: Pulmonary effort is normal. No tachypnea or respiratory distress.     Breath sounds: Normal breath sounds. No decreased breath sounds, wheezing, rhonchi or rales.  Chest:     Chest wall: No tenderness.  Abdominal:       General: Bowel sounds are normal.     Palpations: Abdomen is soft.  Musculoskeletal:        General: Normal range of motion.     Cervical back: Normal range of motion.  Skin:    General: Skin is warm and dry.  Neurological:     Mental Status: She is alert and oriented to person, place, and time.     Coordination: Coordination normal.  Psychiatric:        Behavior: Behavior normal. Behavior is cooperative.        Thought Content: Thought content normal.        Judgment: Judgment normal.         Patient has been counseled extensively about nutrition and exercise as well as the importance of adherence with medications and regular follow-up. The patient was given clear instructions to go to ER or return to medical center if symptoms don't improve, worsen or new problems develop. The patient verbalized understanding.   Follow-up: Return for PAP SMEAR and meter check in 4-6 weeks. Gildardo Pounds, FNP-BC [facility] [place], [place]   08/09/2021, 12:40 PM

## 2021-08-10 ENCOUNTER — Other Ambulatory Visit: Payer: Self-pay

## 2021-08-10 LAB — CMP14+EGFR
ALT: 40 IU/L — ABNORMAL HIGH (ref 0–32)
AST: 29 IU/L (ref 0–40)
Albumin/Globulin Ratio: 1.6 (ref 1.2–2.2)
Albumin: 4.4 g/dL (ref 3.8–4.8)
Alkaline Phosphatase: 58 IU/L (ref 44–121)
BUN/Creatinine Ratio: 14 (ref 9–23)
BUN: 11 mg/dL (ref 6–24)
Bilirubin Total: 0.6 mg/dL (ref 0.0–1.2)
CO2: 24 mmol/L (ref 20–29)
Calcium: 9.8 mg/dL (ref 8.7–10.2)
Chloride: 92 mmol/L — ABNORMAL LOW (ref 96–106)
Creatinine, Ser: 0.81 mg/dL (ref 0.57–1.00)
Globulin, Total: 2.8 g/dL (ref 1.5–4.5)
Glucose: 364 mg/dL — ABNORMAL HIGH (ref 70–99)
Potassium: 4.7 mmol/L (ref 3.5–5.2)
Sodium: 135 mmol/L (ref 134–144)
Total Protein: 7.2 g/dL (ref 6.0–8.5)
eGFR: 88 mL/min/{1.73_m2} (ref >59)

## 2021-08-12 LAB — FECAL OCCULT BLOOD, IMMUNOCHEMICAL: Fecal Occult Bld: NEGATIVE

## 2021-08-15 ENCOUNTER — Telehealth: Payer: Self-pay

## 2021-08-15 NOTE — Telephone Encounter (Signed)
-----   Message from Claiborne Rigg, NP sent at 08/11/2021  8:05 AM EDT ----- Glucose is very elevated however we have discussed this during your recent office visit.  Kidney function and liver function are normal at this time.

## 2021-08-17 ENCOUNTER — Telehealth: Payer: Self-pay

## 2021-08-17 NOTE — Telephone Encounter (Signed)
-----   Message from Claiborne Rigg, NP sent at 08/16/2021  8:23 AM EST ----- Negative stool test for blood.  We will repeat next year.

## 2021-08-17 NOTE — Telephone Encounter (Signed)
Called patient reviewed all information and repeated back to me. Will call if any questions.  ? ?

## 2021-09-19 ENCOUNTER — Other Ambulatory Visit: Payer: Self-pay

## 2021-10-04 ENCOUNTER — Other Ambulatory Visit: Payer: Self-pay

## 2021-10-04 ENCOUNTER — Other Ambulatory Visit: Payer: Self-pay | Admitting: Nurse Practitioner

## 2021-10-04 DIAGNOSIS — E1110 Type 2 diabetes mellitus with ketoacidosis without coma: Secondary | ICD-10-CM

## 2021-10-04 MED ORDER — TRUEPLUS 5-BEVEL PEN NEEDLES 31G X 5 MM MISC
6 refills | Status: DC
  Filled 2021-10-04: qty 100, 25d supply, fill #0

## 2021-10-06 ENCOUNTER — Other Ambulatory Visit: Payer: Self-pay

## 2021-10-12 ENCOUNTER — Other Ambulatory Visit (HOSPITAL_COMMUNITY): Payer: Self-pay

## 2021-10-23 ENCOUNTER — Ambulatory Visit: Payer: Self-pay | Admitting: Nurse Practitioner

## 2021-10-27 ENCOUNTER — Other Ambulatory Visit: Payer: Self-pay

## 2021-11-27 ENCOUNTER — Other Ambulatory Visit: Payer: Self-pay

## 2021-11-27 ENCOUNTER — Encounter (HOSPITAL_COMMUNITY): Payer: Self-pay | Admitting: *Deleted

## 2021-11-27 ENCOUNTER — Emergency Department (HOSPITAL_COMMUNITY)
Admission: EM | Admit: 2021-11-27 | Discharge: 2021-11-27 | Disposition: A | Discharge status: Home / Self Care | Payer: Self-pay | Attending: Emergency Medicine | Admitting: Emergency Medicine

## 2021-11-27 ENCOUNTER — Emergency Department (HOSPITAL_COMMUNITY): Payer: Self-pay

## 2021-11-27 DIAGNOSIS — Z79899 Other long term (current) drug therapy: Secondary | ICD-10-CM | POA: Insufficient documentation

## 2021-11-27 DIAGNOSIS — R079 Chest pain, unspecified: Secondary | ICD-10-CM | POA: Insufficient documentation

## 2021-11-27 DIAGNOSIS — R002 Palpitations: Secondary | ICD-10-CM | POA: Insufficient documentation

## 2021-11-27 DIAGNOSIS — R000 Tachycardia, unspecified: Secondary | ICD-10-CM | POA: Insufficient documentation

## 2021-11-27 LAB — COMPREHENSIVE METABOLIC PANEL
ALT: 61 U/L — ABNORMAL HIGH (ref 0–44)
AST: 52 U/L — ABNORMAL HIGH (ref 15–41)
Albumin: 3.8 g/dL (ref 3.5–5.0)
Alkaline Phosphatase: 50 U/L (ref 38–126)
Anion gap: 12 (ref 5–15)
BUN: 13 mg/dL (ref 6–20)
CO2: 25 mmol/L (ref 22–32)
Calcium: 9.1 mg/dL (ref 8.9–10.3)
Chloride: 98 mmol/L (ref 98–111)
Creatinine, Ser: 0.67 mg/dL (ref 0.44–1.00)
GFR, Estimated: >60 mL/min (ref >60)
Glucose, Bld: 261 mg/dL — ABNORMAL HIGH (ref 70–99)
Potassium: 3.6 mmol/L (ref 3.5–5.1)
Sodium: 135 mmol/L (ref 135–145)
Total Bilirubin: 0.9 mg/dL (ref 0.3–1.2)
Total Protein: 6.9 g/dL (ref 6.5–8.1)

## 2021-11-27 LAB — CBC
HCT: 40.5 % (ref 36.0–46.0)
Hemoglobin: 12.8 g/dL (ref 12.0–15.0)
MCH: 27.8 pg (ref 26.0–34.0)
MCHC: 31.6 g/dL (ref 30.0–36.0)
MCV: 87.9 fL (ref 80.0–100.0)
Platelets: 206 10*3/uL (ref 150–400)
RBC: 4.61 MIL/uL (ref 3.87–5.11)
RDW: 12.7 % (ref 11.5–15.5)
WBC: 7.5 10*3/uL (ref 4.0–10.5)
nRBC: 0 % (ref 0.0–0.2)

## 2021-11-27 LAB — TROPONIN I (HIGH SENSITIVITY)
Troponin I (High Sensitivity): 14 ng/L (ref <18)
Troponin I (High Sensitivity): 14 ng/L (ref <18)

## 2021-11-27 LAB — D-DIMER, QUANTITATIVE: D-Dimer, Quant: 0.35 ug/mL-FEU (ref 0.00–0.50)

## 2021-11-27 NOTE — ED Triage Notes (Signed)
Patient presents to ED c/o chest pain and sob onset several months ago, states her chest pain is worse when she lays on her stomach. States she wants to be test for MOLD exposure , states she has mold in her house.

## 2021-11-27 NOTE — Discharge Instructions (Addendum)
Please continue your home medication Please recheck with your doctor in the next 1 to 2 weeks Return if you are worse anytime

## 2021-11-27 NOTE — ED Provider Notes (Signed)
[facility] EMERGENCY DEPARTMENT Provider Note   CSN: [id number] Arrival date & time: 11/27/21  0093     History  No chief complaint on file.   Phyllis Kemp is a 51 y.o. female.  HPI 51 year old female presents today complaining of palpitations intermittently for the past several months.  She denies any chest pain.  Triage note reviewed and notes of pain seen.  However, patient does not complain of pain but palpitations to me.  She does have pain sometimes when she rolls over onto her stomach.  There is no other associated pain, dyspnea, cough, fever, or chills.  This worsens when she exerts herself such as walking across the street quickly.  She does not climb steps or do any other exertion however, she states that when she works she is constantly moving and does not have any pain or discomfort at that time.  She denies any swelling in her legs and denies any history of PE or DVT.  She has not had any productive cough and does have occasional coughing.  She states that she has had mold in her house and could have had exposure to mold.    Home Medications Prior to Admission medications   Medication Sig Start Date End Date Taking? Authorizing Provider  amLODipine (NORVASC) 10 MG tablet TAKE 1 TABLET (10 MG TOTAL) BY MOUTH DAILY. Patient taking differently: Take 10 mg by mouth daily. 08/09/21 08/09/22 Yes Gildardo Pounds, NP  atorvastatin (LIPITOR) 40 MG tablet TAKE 1 TABLET (40 MG TOTAL) BY MOUTH DAILY. Patient taking differently: Take 40 mg by mouth every other day. Alternates with Fenofibrate 08/09/21 08/09/22 Yes Gildardo Pounds, NP  Dulaglutide 1.5 MG/0.5ML SOPN INJECT 1.5 MG INTO THE SKIN ONCE A WEEK. Patient taking differently: Inject 1.5 mg into the skin once a week. Thursday's 08/09/21 08/09/22 Yes Gildardo Pounds, NP  fenofibrate (TRICOR) 48 MG tablet TAKE 1 TABLET (48 MG TOTAL) BY MOUTH DAILY. Patient taking differently: Take 48 mg by mouth every other  day. Alternates with Atorvastatin 08/09/21 08/09/22 Yes Gildardo Pounds, NP  gabapentin (NEURONTIN) 300 MG capsule Take 1 capsule (300 mg total) by mouth 3 (three) times daily. Patient taking differently: Take 300 mg by mouth 3 (three) times daily as needed (nerve pain). 08/09/21  Yes Gildardo Pounds, NP  glimepiride (AMARYL) 4 MG tablet TAKE 2 TABLETS (8 MG TOTAL) BY MOUTH DAILY BEFORE BREAKFAST. Patient taking differently: Take 8 mg by mouth daily. 08/09/21 08/09/22 Yes Gildardo Pounds, NP  insulin glargine (LANTUS SOLOSTAR) 100 UNIT/ML Solostar Pen Inject 15 Units into the skin daily. Patient taking differently: Inject 15 Units into the skin at bedtime. 08/09/21  Yes Gildardo Pounds, NP  lisinopril (ZESTRIL) 40 MG tablet Take 1 tablet (40 mg total) by mouth daily. 08/09/21 01/13/22 Yes Gildardo Pounds, NP  metFORMIN (GLUCOPHAGE) 500 MG tablet TAKE 2 TABLETS (1,000 MG TOTAL) BY MOUTH 2 (TWO) TIMES DAILY WITH A MEAL. Patient taking differently: Take 500-1,000 mg by mouth 2 (two) times daily with a meal. 08/09/21 08/09/22 Yes Gildardo Pounds, NP  Multiple Vitamins-Minerals (ONE-A-DAY WOMENS PO) Take 2 tablets by mouth daily. Chewable   Yes [provider]  Blood Glucose Monitoring Suppl (TRUE METRIX METER) w/Device KIT Use as instructed 10/25/20   Gildardo Pounds, NP  Insulin Pen Needle (TRUEPLUS 5-BEVEL PEN NEEDLES) 31G X 5 MM MISC Use as instructed. Inject into the skin once nightly. 10/04/21   Gildardo Pounds, NP  Allergies    Patient has no known allergies.    Review of Systems   Review of Systems  All other systems reviewed and are negative.  Physical Exam Updated Vital Signs BP (!) 162/98 (BP Location: Right Arm)    Pulse (!) 105    Temp 98.6 F (37 C) (Oral)    Resp (!) 21    Ht 1.702 m (5' 7" )    Wt 85.7 kg    SpO2 93%    BMI 29.60 kg/m  Physical Exam Vitals and nursing note reviewed.  Constitutional:      Appearance: Normal appearance.  HENT:     Head: Normocephalic.      Right Ear: External ear normal.     Left Ear: External ear normal.     Nose: Nose normal.     Mouth/Throat:     Mouth: Mucous membranes are moist.  Eyes:     Pupils: Pupils are equal, round, and reactive to light.  Cardiovascular:     Rate and Rhythm: Normal rate and regular rhythm.     Pulses: Normal pulses.  Pulmonary:     Effort: Pulmonary effort is normal.     Breath sounds: Normal breath sounds.  Abdominal:     General: Abdomen is flat. Bowel sounds are normal.     Palpations: Abdomen is soft.  Musculoskeletal:        General: No swelling or tenderness. Normal range of motion.     Right lower leg: No edema.     Left lower leg: No edema.  Skin:    General: Skin is warm and dry.     Capillary Refill: Capillary refill takes less than 2 seconds.  Neurological:     General: No focal deficit present.     Mental Status: She is alert.  Psychiatric:        Mood and Affect: Mood normal.    ED Results / Procedures / Treatments   Labs (all labs ordered are listed, but only abnormal results are displayed) Labs Reviewed  COMPREHENSIVE METABOLIC PANEL - Abnormal; Notable for the following components:      Result Value   Glucose, Bld 261 (*)    AST 52 (*)    ALT 61 (*)    All other components within normal limits  CBC  D-DIMER, QUANTITATIVE  TROPONIN I (HIGH SENSITIVITY)  TROPONIN I (HIGH SENSITIVITY)    EKG EKG Interpretation  Date/Time:  Monday November 27 2021 09:06:47 EST Ventricular Rate:  106 PR Interval:  131 QRS Duration: 81 QT Interval:  356 QTC Calculation: 473 R Axis:   -41 Text Interpretation: Sinus tachycardia Left axis deviation Low voltage, precordial leads Abnormal R-wave progression, early transition Confirmed by Pattricia Boss [phone]) on 11/27/2021 3:21:30 PM  Radiology DG Chest Port 1 View  Result Date: 11/27/2021 CLINICAL DATA:  Chest tightness, shortness of breath EXAM: PORTABLE CHEST 1 VIEW COMPARISON:  Chest radiograph 03/05/2019 FINDINGS:  The heart is enlarged. The mediastinal contours are within normal limits. There is no focal consolidation or pulmonary edema. There is no pleural effusion or pneumothorax There is no acute osseous abnormality. IMPRESSION: Cardiomegaly. No radiographic evidence of acute cardiopulmonary process. Electronically Signed   By: Valetta Mole M.D.   On: 11/27/2021 10:28    Procedures Procedures    Medications Ordered in ED Medications - No data to display  ED Course/ Medical Decision Making/ A&P Clinical Course as of 11/27/21 1858  Mon Nov 27, 2021  1524 CBC reviewed interpreted  within normal limits C-Met reviewed with hyperglycemia and mildly elevated transaminases Troponin 14 and repeat troponin 14 remaining flat D-dimer normal at 0.35 [DR]    Clinical Course User Index [DR] Pattricia Boss, MD                           Medical Decision Making 51 year old female presents today complaining of palpitations and pain when she rolls onto her chest. EKG without any acutely ischemic changes noted. Troponin is 14 and remains 14 after several hours. Patient is hyperglycemic but appears to be consistently hyperglycemic in the 200-300 range on multiple visits to the ED recent labs Given her stable troponin, atypical chest discomfort with pain being very positional, low index of suspicion for MI or ACS.  Heart size is slightly enlarged and bedside US done with no evidence of effusion. HR ranged 89-123- nsr, low index of suspicion for pe with normal d-dimer and no other risk factor    Amount and/or Complexity of Data Reviewed Labs: ordered. Radiology: ordered.           Final Clinical Impression(s) / ED Diagnoses Final diagnoses:  Palpitations  Tachycardia    Rx / DC Orders ED Discharge Orders     None         Pattricia Boss, MD 11/27/21 [phone]

## 2021-12-04 ENCOUNTER — Other Ambulatory Visit: Payer: Self-pay

## 2021-12-04 ENCOUNTER — Other Ambulatory Visit (HOSPITAL_COMMUNITY): Payer: Self-pay

## 2021-12-11 ENCOUNTER — Other Ambulatory Visit: Payer: Self-pay

## 2021-12-11 ENCOUNTER — Ambulatory Visit: Payer: Self-pay | Admitting: Nurse Practitioner

## 2022-01-16 ENCOUNTER — Emergency Department (HOSPITAL_COMMUNITY)
Admission: EM | Admit: 2022-01-16 | Discharge: 2022-01-16 | Disposition: A | Discharge status: Home / Self Care | Payer: Self-pay | Attending: Emergency Medicine | Admitting: Emergency Medicine

## 2022-01-16 ENCOUNTER — Other Ambulatory Visit: Payer: Self-pay

## 2022-01-16 ENCOUNTER — Emergency Department (HOSPITAL_COMMUNITY): Payer: Self-pay

## 2022-01-16 ENCOUNTER — Encounter (HOSPITAL_COMMUNITY): Payer: Self-pay | Admitting: Emergency Medicine

## 2022-01-16 DIAGNOSIS — I10 Essential (primary) hypertension: Secondary | ICD-10-CM | POA: Insufficient documentation

## 2022-01-16 DIAGNOSIS — E1165 Type 2 diabetes mellitus with hyperglycemia: Secondary | ICD-10-CM

## 2022-01-16 DIAGNOSIS — E119 Type 2 diabetes mellitus without complications: Secondary | ICD-10-CM | POA: Insufficient documentation

## 2022-01-16 DIAGNOSIS — Z79899 Other long term (current) drug therapy: Secondary | ICD-10-CM | POA: Insufficient documentation

## 2022-01-16 DIAGNOSIS — J189 Pneumonia, unspecified organism: Secondary | ICD-10-CM

## 2022-01-16 DIAGNOSIS — Z794 Long term (current) use of insulin: Secondary | ICD-10-CM | POA: Insufficient documentation

## 2022-01-16 DIAGNOSIS — J181 Lobar pneumonia, unspecified organism: Secondary | ICD-10-CM | POA: Insufficient documentation

## 2022-01-16 DIAGNOSIS — Z7984 Long term (current) use of oral hypoglycemic drugs: Secondary | ICD-10-CM | POA: Insufficient documentation

## 2022-01-16 DIAGNOSIS — U071 COVID-19: Secondary | ICD-10-CM | POA: Insufficient documentation

## 2022-01-16 LAB — BASIC METABOLIC PANEL
Anion gap: 10 (ref 5–15)
BUN: <5 mg/dL — ABNORMAL LOW (ref 6–20)
CO2: 25 mmol/L (ref 22–32)
Calcium: 9.1 mg/dL (ref 8.9–10.3)
Chloride: 100 mmol/L (ref 98–111)
Creatinine, Ser: 0.6 mg/dL (ref 0.44–1.00)
GFR, Estimated: >60 mL/min (ref >60)
Glucose, Bld: 372 mg/dL — ABNORMAL HIGH (ref 70–99)
Potassium: 3.4 mmol/L — ABNORMAL LOW (ref 3.5–5.1)
Sodium: 135 mmol/L (ref 135–145)

## 2022-01-16 LAB — CBC WITH DIFFERENTIAL/PLATELET
Abs Immature Granulocytes: 0.02 10*3/uL (ref 0.00–0.07)
Basophils Absolute: 0 10*3/uL (ref 0.0–0.1)
Basophils Relative: 0 %
Eosinophils Absolute: 0.1 10*3/uL (ref 0.0–0.5)
Eosinophils Relative: 2 %
HCT: 39.4 % (ref 36.0–46.0)
Hemoglobin: 13 g/dL (ref 12.0–15.0)
Immature Granulocytes: 0 %
Lymphocytes Relative: 25 %
Lymphs Abs: 1.4 10*3/uL (ref 0.7–4.0)
MCH: 28.6 pg (ref 26.0–34.0)
MCHC: 33 g/dL (ref 30.0–36.0)
MCV: 86.8 fL (ref 80.0–100.0)
Monocytes Absolute: 0.7 10*3/uL (ref 0.1–1.0)
Monocytes Relative: 12 %
Neutro Abs: 3.5 10*3/uL (ref 1.7–7.7)
Neutrophils Relative %: 61 %
Platelets: 177 10*3/uL (ref 150–400)
RBC: 4.54 MIL/uL (ref 3.87–5.11)
RDW: 12.4 % (ref 11.5–15.5)
WBC: 5.8 10*3/uL (ref 4.0–10.5)
nRBC: 0 % (ref 0.0–0.2)

## 2022-01-16 LAB — RESP PANEL BY RT-PCR (FLU A&B, COVID) ARPGX2
Influenza A by PCR: NEGATIVE
Influenza B by PCR: NEGATIVE
SARS Coronavirus 2 by RT PCR: POSITIVE — AB

## 2022-01-16 MED ORDER — DOXYCYCLINE HYCLATE 100 MG PO TABS
100.0000 mg | ORAL_TABLET | Freq: Two times a day (BID) | ORAL | 0 refills | Status: AC
  Filled 2022-01-16: qty 10, 5d supply, fill #0

## 2022-01-16 MED ORDER — LISINOPRIL 40 MG PO TABS
40.0000 mg | ORAL_TABLET | Freq: Every day | ORAL | 0 refills | Status: DC
  Filled 2022-01-16: qty 30, 30d supply, fill #0

## 2022-01-16 MED ORDER — NIRMATRELVIR&RITONAVIR 300/100 20 X 150 MG & 10 X 100MG PO TBPK
3.0000 | ORAL_TABLET | Freq: Two times a day (BID) | ORAL | 0 refills | Status: AC
  Filled 2022-01-16: qty 30, 5d supply, fill #0

## 2022-01-16 MED ORDER — LANTUS SOLOSTAR 100 UNIT/ML ~~LOC~~ SOPN
15.0000 [IU] | PEN_INJECTOR | Freq: Every day | SUBCUTANEOUS | 0 refills | Status: DC
  Filled 2022-01-16: qty 12, 80d supply, fill #0

## 2022-01-16 MED ORDER — AMLODIPINE BESYLATE 10 MG PO TABS
10.0000 mg | ORAL_TABLET | Freq: Every day | ORAL | 0 refills | Status: DC
  Filled 2022-01-16: qty 30, 30d supply, fill #0

## 2022-01-16 NOTE — ED Triage Notes (Signed)
Patient complains of cough and generalized body aches that started approximately five days ago after being around someone coughing at a funeral. Patient reports another family member that was at the same funeral has same symptoms and recently tested positive for COVID-19. ?

## 2022-01-16 NOTE — ED Provider Notes (Signed)
?[facility] ?Provider Note ? ? ?CSN: [id number] ?Arrival date & time: 01/16/22  1013 ? ?  ? ?History ? ?Chief Complaint  ?Patient presents with  ? Generalized Body Aches  ? ? ?Phyllis Kemp is a 51 y.o. female with PMHx HTN, HLD, Diabetes who presents to the ED today with complaint of flu like symptoms x 5 days. Pt reports that she attended her mother's funeral in [place] last week and her aunt had been staying with her with a cough. The next day after the funeral pt and her sister both began coughing. Pt also complains of sore throat, headache, congestion, body aches, fevers. Her sister took 2 at home COVID tests which were positive. Pt reports feeling similarly last year when she had COVID and wanted to come get "checked out." No other complaints at this time. Is vaccinated x 3.  ? ?The history is provided by the patient and medical records.  ? ?  ? ?Home Medications ?Prior to Admission medications   ?Medication Sig Start Date End Date Taking? Authorizing Provider  ?doxycycline (VIBRAMYCIN) 100 MG capsule Take 1 capsule (100 mg total) by mouth 2 (two) times daily for 5 days. 01/16/22 01/21/22 Yes Rosy Estabrook, PA-C  ?nirmatrelvir/ritonavir EUA (PAXLOVID) 20 x 150 MG & 10 x 100MG TABS Take 3 tablets by mouth 2 (two) times daily for 5 days. Patient GFR is 60. ?Take nirmatrelvir (150 mg) two tablets twice daily for 5 days and ritonavir (100 mg) one tablet twice daily for 5 days. 01/16/22 01/21/22 Yes Skeeter Sheard, PA-C  ?amLODipine (NORVASC) 10 MG tablet Take 1 tablet (10 mg total) by mouth daily. 01/16/22 02/15/22  Eustaquio Maize, PA-C  ?atorvastatin (LIPITOR) 40 MG tablet TAKE 1 TABLET (40 MG TOTAL) BY MOUTH DAILY. ?Patient taking differently: Take 40 mg by mouth every other day. Alternates with Fenofibrate 08/09/21 08/09/22  Gildardo Pounds, NP  ?Blood Glucose Monitoring Suppl (TRUE METRIX METER) w/Device KIT Use as instructed 10/25/20   Gildardo Pounds, NP   ?Dulaglutide 1.5 MG/0.5ML SOPN INJECT 1.5 MG INTO THE SKIN ONCE A WEEK. ?Patient taking differently: Inject 1.5 mg into the skin once a week. Thursday's 08/09/21 08/09/22  Gildardo Pounds, NP  ?fenofibrate (TRICOR) 48 MG tablet TAKE 1 TABLET (48 MG TOTAL) BY MOUTH DAILY. ?Patient taking differently: Take 48 mg by mouth every other day. Alternates with Atorvastatin 08/09/21 08/09/22  Gildardo Pounds, NP  ?gabapentin (NEURONTIN) 300 MG capsule Take 1 capsule (300 mg total) by mouth 3 (three) times daily. ?Patient taking differently: Take 300 mg by mouth 3 (three) times daily as needed (nerve pain). 08/09/21   Gildardo Pounds, NP  ?glimepiride (AMARYL) 4 MG tablet TAKE 2 TABLETS (8 MG TOTAL) BY MOUTH DAILY BEFORE BREAKFAST. ?Patient taking differently: Take 8 mg by mouth daily. 08/09/21 08/09/22  Gildardo Pounds, NP  ?insulin glargine (LANTUS SOLOSTAR) 100 UNIT/ML Solostar Pen Inject 15 Units into the skin at bedtime. 01/16/22   Eustaquio Maize, PA-C  ?Insulin Pen Needle (TRUEPLUS 5-BEVEL PEN NEEDLES) 31G X 5 MM MISC Use as instructed. Inject into the skin once nightly. 10/04/21   Gildardo Pounds, NP  ?lisinopril (ZESTRIL) 40 MG tablet Take 1 tablet (40 mg total) by mouth daily. 01/16/22 02/15/22  Eustaquio Maize, PA-C  ?metFORMIN (GLUCOPHAGE) 500 MG tablet TAKE 2 TABLETS (1,000 MG TOTAL) BY MOUTH 2 (TWO) TIMES DAILY WITH A MEAL. ?Patient taking differently: Take 500-1,000 mg by mouth 2 (two) times daily with  a meal. 08/09/21 08/09/22  Gildardo Pounds, NP  ?Multiple Vitamins-Minerals (ONE-A-DAY WOMENS PO) Take 2 tablets by mouth daily. Chewable    [provider]  ?   ? ?Allergies    ?Patient has no known allergies.   ? ?Review of Systems   ?Review of Systems  ?Constitutional:  Positive for chills, fatigue and fever.  ?HENT:  Positive for congestion, rhinorrhea and sore throat. Negative for trouble swallowing and voice change.   ?Respiratory:  Positive for cough. Negative for shortness of breath.   ?Cardiovascular:   Negative for chest pain.  ?Neurological:  Positive for headaches.  ?All other systems reviewed and are negative. ? ?Physical Exam ?Updated Vital Signs ?BP (!) 152/88 (BP Location: Right Arm)   Pulse (!) 101   Temp 98.9 ?F (37.2 ?C) (Oral)   Resp 17   SpO2 97%  ?Physical Exam ?Vitals and nursing note reviewed.  ?Constitutional:   ?   Appearance: She is not ill-appearing or diaphoretic.  ?HENT:  ?   Head: Normocephalic and atraumatic.  ?Eyes:  ?   Conjunctiva/sclera: Conjunctivae normal.  ?Cardiovascular:  ?   Rate and Rhythm: Normal rate and regular rhythm.  ?   Pulses: Normal pulses.  ?Pulmonary:  ?   Effort: Pulmonary effort is normal.  ?   Breath sounds: Normal breath sounds. No wheezing, rhonchi or rales.  ?   Comments: Speaking in full sentences. Satting 97% on RA. LCTAB.  ?Abdominal:  ?   Palpations: Abdomen is soft.  ?   Tenderness: There is no abdominal tenderness.  ?Musculoskeletal:  ?   Cervical back: Neck supple.  ?Skin: ?   General: Skin is warm and dry.  ?Neurological:  ?   Mental Status: She is alert.  ? ? ?ED Results / Procedures / Treatments   ?Labs ?(all labs ordered are listed, but only abnormal results are displayed) ?Labs Reviewed  ?RESP PANEL BY RT-PCR (FLU A&B, COVID) ARPGX2 - Abnormal; Notable for the following components:  ?    Result Value  ? SARS Coronavirus 2 by RT PCR POSITIVE (*)   ? All other components within normal limits  ?BASIC METABOLIC PANEL - Abnormal; Notable for the following components:  ? Potassium 3.4 (*)   ? Glucose, Bld 372 (*)   ? BUN <5 (*)   ? All other components within normal limits  ?CBC WITH DIFFERENTIAL/PLATELET  ? ? ?EKG ?None ? ?Radiology ?DG Chest 2 View ? ?Result Date: 01/16/2022 ?CLINICAL DATA:  Productive cough.  Generalized body aches. EXAM: CHEST - 2 VIEW COMPARISON:  11/27/2021 FINDINGS: Heart and mediastinal shadows are normal. Left lung is clear. There is medial right lower lobe pneumonia. No effusion. No acute finding. IMPRESSION: Medial right lower  lobe pneumonia. Electronically Signed   By: Nelson Chimes M.D.   On: 01/16/2022 11:05   ? ?Procedures ?Procedures  ? ? ?Medications Ordered in ED ?Medications - No data to display ? ?ED Course/ Medical Decision Making/ A&P ?  ?                        ?Medical Decision Making ?52 year old female who presents to the ED today with flulike symptoms x5 days to recent sick contact with her aunt.  Patient's sister tested positive for COVID as well, started having symptoms the same day patient did.  Patient is vaccinated x3.  On arrival to the ED vitals are stable.  Patient was medically screened and work-up started including  CBC, BMP, chest x-ray, COVID/flu swab. ? ?Chest x-ray with findings of medial right lower lobe pneumonia. ?CBC without leukocytosis.  Hemoglobin stable at 13.0. ?BMP with a glucose 372, history of diabetes.  Bicarb within normal limits at 25.  No gap.  Creatinine 0.60 with a GFR greater than 60.  ?COVID test positive.  ? ?On evaluation patient resting comfortably.  Able to speak in full sentence without difficulty.  Satting 97% on room air.  Lungs clear to auscultation bilaterally.  Appears to be in no acute distress and is nontoxic-appearing.  We will plan to discharge home with Paxlovid as she is currently in the window meets criteria given her history of diabetes, hypertension, hyperlipidemia.  Also discharged home with 5 days of doxycycline to cover for pneumonia on chest x-ray however suspect that this is likely viral in nature.  Patient instructed on self-isolation measures, PCP follow-up for reevaluation, strict return precautions.  She is also requesting refills of her lisinopril, amlodipine, Lantus pen.  We will plan to discharge home with same.  Instructed to hold off on her Lipitor for the next 5 days while on the Paxlovid as well as half dose of amlodipine.  She is in agreement plan and stable for discharge.  ? ? ? ? ? ? ? ? ? ?Final Clinical Impression(s) / ED Diagnoses ?Final diagnoses:   ?COVID-19  ?Community acquired pneumonia of right middle lobe of lung  ? ? ?Rx / DC Orders ?ED Discharge Orders   ? ?      Ordered  ?  nirmatrelvir/ritonavir EUA (PAXLOVID) 20 x 150 MG & 10 x 100MG TABS  2 times d

## 2022-01-16 NOTE — Discharge Instructions (Signed)
You have tested POSITIVE for COVID 19 today. Please self isolate for 5 days from symptom onset (cleared: 04/13). You can return to work after that time however it is recommended that you wear a mask for an additional 5 days time.  ? ?Pick up the medication prescribed to cover for your COVID infection as well as possibility of pneumonia seen on chest xray.  ? ?I have also refilled your prescriptions of Lisinopril, Amlodipine, and Lantus pen.  ? ?You should cut the Amlodipine dose in 1/2 over the next 5 days while on the Paxlovid medication for COVID 19. You should also not take the Atorvastatin (Lipitor) medication while on Paxlovid and for an additional 5 days afterwards (total of 10 days time).  ? ?Follow up with your PCP for further evaluation and for recheck of symptoms.  ? ?Return to the ED IMMEDIATELY for any new/worsening symptoms ?

## 2022-01-16 NOTE — ED Provider Triage Note (Signed)
Emergency Medicine Provider Triage Evaluation Note ? ?Phyllis Kemp , a 51 y.o. female  was evaluated in triage.  Pt complains of flulike illness.  Patient reports that her symptoms started on Friday and have gotten progressively worse since then.  Patient states that she was staying with her sister over the weekend and her sister tested positive for COVID-19. ? ?Review of Systems  ?Positive: Fever, chills, generalized myalgia, productive cough, fatigue ?Negative: Chest pain, shortness of breath, nausea, vomiting, abdominal pain, diarrhea ? ?Physical Exam  ?BP (!) 175/97 (BP Location: Right Arm)   Pulse (!) 103   Temp 99.4 ?F (37.4 ?C) (Oral)   Resp 16   SpO2 100%  ?Gen:   Awake, no distress   ?Resp:  Normal effort, clear to auscultation bilaterally.  Speaks in full sentences without difficulty. ?MSK:   Moves extremities without difficulty  ?Other:   ? ?Medical Decision Making  ?Medically screening exam initiated at 10:48 AM.  Appropriate orders placed.  [name] was informed that the remainder of the evaluation will be completed by another provider, this initial triage assessment does not replace that evaluation, and the importance of remaining in the ED until their evaluation is complete. ? ?Due to patient's diabetes she would be a candidate for antiviral medication.  Patient reports that she would want antiviral medication if positive for COVID-19. ?  ?[name], PA-C ?01/16/22 1049 ? ?

## 2022-02-21 ENCOUNTER — Emergency Department (HOSPITAL_COMMUNITY)
Admission: EM | Admit: 2022-02-21 | Discharge: 2022-02-21 | Disposition: A | Discharge status: Home / Self Care | Payer: Self-pay | Attending: Emergency Medicine | Admitting: Emergency Medicine

## 2022-02-21 DIAGNOSIS — E119 Type 2 diabetes mellitus without complications: Secondary | ICD-10-CM | POA: Insufficient documentation

## 2022-02-21 DIAGNOSIS — Z794 Long term (current) use of insulin: Secondary | ICD-10-CM | POA: Insufficient documentation

## 2022-02-21 DIAGNOSIS — K029 Dental caries, unspecified: Secondary | ICD-10-CM | POA: Insufficient documentation

## 2022-02-21 DIAGNOSIS — Z79899 Other long term (current) drug therapy: Secondary | ICD-10-CM | POA: Insufficient documentation

## 2022-02-21 DIAGNOSIS — I10 Essential (primary) hypertension: Secondary | ICD-10-CM | POA: Insufficient documentation

## 2022-02-21 DIAGNOSIS — Z7984 Long term (current) use of oral hypoglycemic drugs: Secondary | ICD-10-CM | POA: Insufficient documentation

## 2022-02-21 MED ORDER — LIDOCAINE VISCOUS HCL 2 % MT SOLN
15.0000 mL | OROMUCOSAL | 0 refills | Status: DC | PRN
  Filled 2022-02-21: qty 100, 6d supply, fill #0

## 2022-02-21 MED ORDER — IBUPROFEN 600 MG PO TABS
600.0000 mg | ORAL_TABLET | Freq: Four times a day (QID) | ORAL | 0 refills | Status: DC | PRN
  Filled 2022-02-21: qty 30, 8d supply, fill #0

## 2022-02-21 MED ORDER — LIDOCAINE VISCOUS HCL 2 % MT SOLN
15.0000 mL | Freq: Once | OROMUCOSAL | Status: AC
  Administered 2022-02-21: 15 mL via OROMUCOSAL
  Filled 2022-02-21: qty 15

## 2022-02-21 MED ORDER — IBUPROFEN 400 MG PO TABS
600.0000 mg | ORAL_TABLET | Freq: Once | ORAL | Status: AC
  Administered 2022-02-21: 600 mg via ORAL
  Filled 2022-02-21: qty 1

## 2022-02-21 MED ORDER — ACETAMINOPHEN 500 MG PO TABS
500.0000 mg | ORAL_TABLET | Freq: Four times a day (QID) | ORAL | 0 refills | Status: DC | PRN
  Filled 2022-02-21: qty 30, 8d supply, fill #0

## 2022-02-21 MED ORDER — AMOXICILLIN 500 MG PO CAPS
500.0000 mg | ORAL_CAPSULE | Freq: Two times a day (BID) | ORAL | 0 refills | Status: DC
  Filled 2022-02-21: qty 21, 11d supply, fill #0

## 2022-02-21 NOTE — ED Notes (Signed)
Pt A&OX4 ambulatory at d/c with independent steady gait. Pt verbalized understanding of d/c instructions, prescriptions and follow up care. 

## 2022-02-21 NOTE — ED Provider Notes (Signed)
[facility] EMERGENCY DEPARTMENT Provider Note   CSN: [id number] Arrival date & time: 02/21/22  1556     History  Chief Complaint  Patient presents with   Dental Pain    Phyllis Kemp is a 51 y.o. female with a past medical history of type 2 diabetes and hyperlipidemia presenting today with right-sided dental pain.  Says has been going on for the last 3 days.  Complaining of sharp and constant pain to the right lower teeth that is now radiating up into her face.  Denies trismus but endorses pain with chewing.  No fevers or chills.  No difficulty swallowing or breathing.Ibuprofen has helped but she does not know how much she is able to take and is concerned she might overdose.  Has been using 200 mg tablets 3 times a day   Dental Pain     Home Medications Prior to Admission medications   Medication Sig Start Date End Date Taking? Authorizing Provider  acetaminophen (TYLENOL) 500 MG tablet Take 1 tablet (500 mg total) by mouth every 6 (six) hours as needed. 02/21/22  Yes Delorese Sellin A, PA-C  amLODipine (NORVASC) 10 MG tablet Take 10 mg by mouth daily.   Yes [provider]  amoxicillin (AMOXIL) 500 MG capsule Take 1 capsule (500 mg total) by mouth 2 (two) times daily. 02/21/22  Yes Joyanna Kleman A, PA-C  Dulaglutide 1.5 MG/0.5ML SOPN INJECT 1.5 MG INTO THE SKIN ONCE A WEEK. Patient taking differently: Inject 1.5 mg into the skin every 30 (thirty) days. 08/09/21 08/09/22 Yes Gildardo Pounds, NP  gabapentin (NEURONTIN) 300 MG capsule Take 1 capsule (300 mg total) by mouth 3 (three) times daily. Patient taking differently: Take 300 mg by mouth 3 (three) times daily as needed (nerve pain). 08/09/21  Yes Gildardo Pounds, NP  ibuprofen (ADVIL) 600 MG tablet Take 1 tablet (600 mg total) by mouth every 6 (six) hours as needed. 02/21/22  Yes Aldeen Riga A, PA-C  insulin glargine (LANTUS SOLOSTAR) 100 UNIT/ML Solostar Pen Inject 15 Units into the skin  at bedtime. 01/16/22  Yes Venter, Margaux, PA-C  lidocaine (XYLOCAINE) 2 % solution Use as directed 15 mLs in the mouth or throat as needed for mouth pain. 02/21/22  Yes Saniyah Mondesir A, PA-C  lisinopril (ZESTRIL) 40 MG tablet Take 40 mg by mouth daily.   Yes [provider]  metFORMIN (GLUCOPHAGE) 500 MG tablet TAKE 2 TABLETS (1,000 MG TOTAL) BY MOUTH 2 (TWO) TIMES DAILY WITH A MEAL. Patient taking differently: Take 1,000 mg by mouth 2 (two) times daily with a meal. 08/09/21 08/09/22 Yes Gildardo Pounds, NP  Multiple Vitamins-Minerals (ONE-A-DAY WOMENS PO) Take 2 tablets by mouth daily. Chewable   Yes [provider]  naproxen sodium (ALEVE) 220 MG tablet Take 220 mg by mouth daily as needed (pain).   Yes [provider]  amLODipine (NORVASC) 10 MG tablet Take 1 tablet (10 mg total) by mouth daily. 01/16/22 02/15/22  Venter, Margaux, PA-C  atorvastatin (LIPITOR) 40 MG tablet TAKE 1 TABLET (40 MG TOTAL) BY MOUTH DAILY. 08/09/21 08/09/22  Gildardo Pounds, NP  Blood Glucose Monitoring Suppl (TRUE METRIX METER) w/Device KIT Use as instructed 10/25/20   Gildardo Pounds, NP  fenofibrate (TRICOR) 48 MG tablet TAKE 1 TABLET (48 MG TOTAL) BY MOUTH DAILY. Patient not taking: Reported on 02/21/2022 08/09/21 08/09/22  Gildardo Pounds, NP  glimepiride (AMARYL) 4 MG tablet TAKE 2 TABLETS (8 MG TOTAL) BY MOUTH  DAILY BEFORE BREAKFAST. Patient not taking: Reported on 02/21/2022 08/09/21 08/09/22  Gildardo Pounds, NP  Insulin Pen Needle (TRUEPLUS 5-BEVEL PEN NEEDLES) 31G X 5 MM MISC Use as instructed. Inject into the skin once nightly. 10/04/21   Gildardo Pounds, NP  lisinopril (ZESTRIL) 40 MG tablet Take 1 tablet (40 mg total) by mouth daily. 01/16/22 02/15/22  Eustaquio Maize, PA-C      Allergies    Patient has no known allergies.    Review of Systems   Review of Systems  Physical Exam Updated Vital Signs BP (!) 172/97   Pulse (!) 101   Temp 99.1 F (37.3 C)   Resp 19   SpO2 98%   Physical Exam Vitals and nursing note reviewed.  Constitutional:      Appearance: Normal appearance.  HENT:     Head: Normocephalic and atraumatic.     Mouth/Throat:     Mouth: Mucous membranes are moist.     Pharynx: Oropharynx is clear.     Comments: right upper and right lower second molar tooth fracture.  Surrounding infection on the lower tooth, no abscess. Eyes:     General: No scleral icterus.    Conjunctiva/sclera: Conjunctivae normal.  Pulmonary:     Effort: Pulmonary effort is normal. No respiratory distress.  Skin:    Findings: No rash.  Neurological:     Mental Status: She is alert.  Psychiatric:        Mood and Affect: Mood normal.    ED Results / Procedures / Treatments   Labs (all labs ordered are listed, but only abnormal results are displayed) Labs Reviewed - No data to display  EKG None  Radiology No results found.  Procedures Procedures   Medications Ordered in ED Medications  ibuprofen (ADVIL) tablet 600 mg (600 mg Oral Given 02/21/22 2218)  lidocaine (XYLOCAINE) 2 % viscous mouth solution 15 mL (15 mLs Mouth/Throat Given 02/21/22 2219)    ED Course/ Medical Decision Making/ A&P                           Medical Decision Making Risk OTC drugs. Prescription drug management.   51 year old female presenting with dental pain.  She has not been able to get in touch with a dentist.  Does not have health insurance.  Plans to pay out-of-pocket but she says that she needs some pain control for the night.  Physical exam revealing of a right upper and right lower second molar tooth fracture.  Surrounding infection on the lower tooth, no abscess.  Airway clear and tolerating secretions. Patient was mildly tachycardic to 102, temperature in the 99's.  Does not meet SIRS or sepsis criteria however she will be covered with amoxicillin for any dental infection.  She was also hypertensive but says her blood pressures have been under control at home. No H/A,  CP, Abd/back pain.   Treatment: given ibuprofen and lidocaine solution. She feels better on evaluation. We discussed daily maximums of tylenol and ibuprofen and instructions for alternating these. Ibuprofen, tylenol, lidocaine and amoxicillin sent to pharmacy and she will follow-up with a dentist.    Final Clinical Impression(s) / ED Diagnoses Final diagnoses:  Infected dental caries    Rx / DC Orders ED Discharge Orders          Ordered    ibuprofen (ADVIL) 600 MG tablet  Every 6 hours PRN        02/21/22 2209  acetaminophen (TYLENOL) 500 MG tablet  Every 6 hours PRN        02/21/22 2209    lidocaine (XYLOCAINE) 2 % solution  As needed        02/21/22 2209    amoxicillin (AMOXIL) 500 MG capsule  2 times daily        02/21/22 2223           Results and diagnoses were explained to the patient. Return precautions discussed in full. Patient had no additional questions and expressed complete understanding.   This chart was dictated using voice recognition software.  Despite best efforts to proofread,  errors can occur which can change the documentation meaning.    Darliss Ridgel 02/21/22 2330    Lajean Saver, MD 02/22/22 1616

## 2022-02-21 NOTE — ED Triage Notes (Signed)
Pt c/o R lower molar dental pain x3 days, OTC pain medication no relief. Denies regular dentist ?

## 2022-02-21 NOTE — Discharge Instructions (Addendum)
There is a list of potentially cost friendly dental clinics attached to these papers.  Get in touch with them or another dentist as soon as possible.  I hope that you feel better!  Take the antibiotic as prescribed ?

## 2022-02-22 ENCOUNTER — Other Ambulatory Visit: Payer: Self-pay

## 2022-02-28 ENCOUNTER — Other Ambulatory Visit: Payer: Self-pay | Admitting: Nurse Practitioner

## 2022-02-28 DIAGNOSIS — I10 Essential (primary) hypertension: Secondary | ICD-10-CM

## 2022-02-28 NOTE — Telephone Encounter (Signed)
Medication Refill - Medication: amLODipine (NORVASC) 10 MG tablet  lisinopril (ZESTRIL) 40 MG tablet  Has the patient contacted their pharmacy? Yes.   (Agent: If no, request that the patient contact the pharmacy for the refill. If patient does not wish to contact the pharmacy document the reason why and proceed with request.) (Agent: If yes, when and what did the pharmacy advise?)  Preferred Pharmacy (with phone number or street name):  [facility] [address] [place] [address]  Phone: [phone] Fax: [phone]   Has the patient been seen for an appointment in the last year OR does the patient have an upcoming appointment? Yes.    Agent: Please be advised that RX refills may take up to 3 business days. We ask that you follow-up with your pharmacy.

## 2022-03-01 ENCOUNTER — Other Ambulatory Visit: Payer: Self-pay

## 2022-03-01 ENCOUNTER — Telehealth: Payer: Self-pay | Admitting: Nurse Practitioner

## 2022-03-01 DIAGNOSIS — I10 Essential (primary) hypertension: Secondary | ICD-10-CM

## 2022-03-01 MED ORDER — LISINOPRIL 40 MG PO TABS
40.0000 mg | ORAL_TABLET | Freq: Every day | ORAL | 2 refills | Status: DC
  Filled 2022-03-01 – 2022-03-13 (×2): qty 30, 30d supply, fill #0

## 2022-03-01 MED ORDER — AMLODIPINE BESYLATE 10 MG PO TABS
10.0000 mg | ORAL_TABLET | Freq: Every day | ORAL | 2 refills | Status: DC
  Filled 2022-03-01 – 2022-03-13 (×2): qty 30, 30d supply, fill #0

## 2022-03-01 NOTE — Telephone Encounter (Signed)
Requested today 03/01/22.  Requested Prescriptions  Refused Prescriptions Disp Refills  . amLODipine (NORVASC) 10 MG tablet 30 tablet 0    Sig: Take 1 tablet (10 mg total) by mouth daily.     Cardiovascular: Calcium Channel Blockers 2 Failed - 02/28/2022 11:08 AM      Failed - Last BP in normal range    BP Readings from Last 1 Encounters:  02/21/22 (!) 172/97         Failed - Valid encounter within last 6 months    Recent Outpatient Visits          6 months ago Type 2 diabetes mellitus with hyperglycemia, without long-term current use of insulin [facility])   [place], Vernia Buff, NP   1 year ago Essential hypertension   [place], Vernia Buff, NP   1 year ago Diabetes mellitus type 2, uncontrolled, with complications [facility])   [place], [place] W, NP   2 years ago Diabetes mellitus type 2, uncontrolled, with complications [facility])   [place], [place] W, NP   2 years ago Diabetes mellitus type 2, uncontrolled, with complications [facility])   [place] Gildardo Pounds, NP      Future Appointments            In 2 months Gildardo Pounds, NP [place] - Last Heart Rate in normal range    Pulse Readings from Last 1 Encounters:  02/21/22 (!) 101         . lisinopril (ZESTRIL) 40 MG tablet      Sig: Take 1 tablet (40 mg total) by mouth daily.     Cardiovascular:  ACE Inhibitors Failed - 02/28/2022 11:08 AM      Failed - K in normal range and within 180 days    Potassium  Date Value Ref Range Status  01/16/2022 3.4 (L) 3.5 - 5.1 mmol/L Final         Failed - Last BP in normal range    BP Readings from Last 1 Encounters:  02/21/22 (!) 172/97         Failed - Valid encounter within last 6 months    Recent Outpatient Visits          6 months ago Type 2  diabetes mellitus with hyperglycemia, without long-term current use of insulin ([place])   [place], Vernia Buff, NP   1 year ago Essential hypertension   [place], Vernia Buff, NP   1 year ago Diabetes mellitus type 2, uncontrolled, with complications [facility])   [place], [place] W, NP   2 years ago Diabetes mellitus type 2, uncontrolled, with complications [facility])   [place], [place] W, NP   2 years ago Diabetes mellitus type 2, uncontrolled, with complications [facility])   [place] Gildardo Pounds, NP      Future Appointments            In 2 months Gildardo Pounds, NP [place] - [place] in normal range and within 180 days  Creat  Date Value Ref Range Status  10/18/2014 0.78 0.50 - 1.10 mg/dL Final   Creatinine, Ser  Date Value Ref Range Status  01/16/2022 0.60 0.44 - 1.00 mg/dL Final   Creatinine, Urine  Date Value Ref Range Status  10/18/2014 240.5 mg/dL Final    Comment:    No reference range established.         Passed - Patient is not pregnant

## 2022-03-08 ENCOUNTER — Other Ambulatory Visit: Payer: Self-pay

## 2022-03-13 ENCOUNTER — Other Ambulatory Visit: Payer: Self-pay

## 2022-03-13 NOTE — Telephone Encounter (Signed)
Scheduled apt 03/23/2022 at 10:50

## 2022-03-13 NOTE — Telephone Encounter (Signed)
Pt has upcoming appt and wants to know if this Rx will be filled, please advise.

## 2022-03-14 ENCOUNTER — Other Ambulatory Visit: Payer: Self-pay

## 2022-03-23 ENCOUNTER — Ambulatory Visit: Payer: Self-pay | Attending: Nurse Practitioner | Admitting: Nurse Practitioner

## 2022-03-23 ENCOUNTER — Other Ambulatory Visit: Payer: Self-pay

## 2022-03-23 ENCOUNTER — Encounter: Payer: Self-pay | Admitting: Nurse Practitioner

## 2022-03-23 VITALS — BP 155/96 | HR 103 | Wt 189.8 lb

## 2022-03-23 DIAGNOSIS — I10 Essential (primary) hypertension: Secondary | ICD-10-CM

## 2022-03-23 DIAGNOSIS — E785 Hyperlipidemia, unspecified: Secondary | ICD-10-CM

## 2022-03-23 DIAGNOSIS — Z794 Long term (current) use of insulin: Secondary | ICD-10-CM

## 2022-03-23 DIAGNOSIS — E1142 Type 2 diabetes mellitus with diabetic polyneuropathy: Secondary | ICD-10-CM

## 2022-03-23 DIAGNOSIS — E1165 Type 2 diabetes mellitus with hyperglycemia: Secondary | ICD-10-CM

## 2022-03-23 LAB — POCT GLYCOSYLATED HEMOGLOBIN (HGB A1C): HbA1c, POC (controlled diabetic range): 12.2 % — AB (ref 0.0–7.0)

## 2022-03-23 LAB — GLUCOSE, POCT (MANUAL RESULT ENTRY): POC Glucose: 371 mg/dl — AB (ref 70–99)

## 2022-03-23 MED ORDER — GABAPENTIN 300 MG PO CAPS
300.0000 mg | ORAL_CAPSULE | Freq: Three times a day (TID) | ORAL | 3 refills | Status: DC
  Filled 2022-03-23 – 2022-04-11 (×2): qty 90, 30d supply, fill #0

## 2022-03-23 MED ORDER — TRULICITY 0.75 MG/0.5ML ~~LOC~~ SOAJ
0.7500 mg | SUBCUTANEOUS | 3 refills | Status: DC
  Filled 2022-03-23: qty 2, 28d supply, fill #0

## 2022-03-23 MED ORDER — BASAGLAR KWIKPEN 100 UNIT/ML ~~LOC~~ SOPN
15.0000 [IU] | PEN_INJECTOR | Freq: Every day | SUBCUTANEOUS | 2 refills | Status: DC
  Filled 2022-03-23: qty 6, 40d supply, fill #0

## 2022-03-23 MED ORDER — TRUEPLUS 5-BEVEL PEN NEEDLES 31G X 5 MM MISC
6 refills | Status: AC
  Filled 2022-03-23: qty 100, 100d supply, fill #0
  Filled 2022-07-23: qty 100, 30d supply, fill #0

## 2022-03-23 MED ORDER — GLIMEPIRIDE 4 MG PO TABS
ORAL_TABLET | Freq: Every day | ORAL | 1 refills | Status: DC
  Filled 2022-03-23 – 2022-04-11 (×2): qty 180, 90d supply, fill #0

## 2022-03-23 MED ORDER — LISINOPRIL 40 MG PO TABS
40.0000 mg | ORAL_TABLET | Freq: Every day | ORAL | 1 refills | Status: DC
  Filled 2022-03-23 – 2022-04-11 (×2): qty 90, 90d supply, fill #0

## 2022-03-23 MED ORDER — METFORMIN HCL 500 MG PO TABS
ORAL_TABLET | Freq: Two times a day (BID) | ORAL | 1 refills | Status: DC
  Filled 2022-03-23: qty 360, 90d supply, fill #0
  Filled 2022-04-11: qty 360, 360d supply, fill #0

## 2022-03-23 MED ORDER — FENOFIBRATE 48 MG PO TABS
ORAL_TABLET | Freq: Every day | ORAL | 2 refills | Status: DC
  Filled 2022-03-23: qty 90, 90d supply, fill #0
  Filled 2022-04-11: qty 30, 30d supply, fill #0

## 2022-03-23 MED ORDER — ATORVASTATIN CALCIUM 40 MG PO TABS
ORAL_TABLET | Freq: Every day | ORAL | 2 refills | Status: DC
  Filled 2022-03-23 – 2022-04-11 (×2): qty 90, 90d supply, fill #0

## 2022-03-23 NOTE — Progress Notes (Signed)
Assessment & Plan:  [name] was seen today for hypertension.  Diagnoses and all orders for this visit:  Type 2 diabetes mellitus with diabetic polyneuropathy, with long-term current use of insulin (HCC) -     POCT glucose (manual entry) -     POCT glycosylated hemoglobin (Hb A1C) -     Dulaglutide (TRULICITY) 0.35 KK/9.3GH SOPN; Inject 0.75 mg into the skin once a week. -     Insulin Glargine (BASAGLAR KWIKPEN) 100 UNIT/ML; Inject 15 Units into the skin once nightly at bedtime. -     metFORMIN (GLUCOPHAGE) 500 MG tablet; TAKE 2 TABLETS (1,000 MG TOTAL) BY MOUTH 2 (TWO) TIMES DAILY WITH A MEAL. -     glimepiride (AMARYL) 4 MG tablet; TAKE 2 TABLETS (8 MG TOTAL) BY MOUTH DAILY BEFORE BREAKFAST. -     gabapentin (NEURONTIN) 300 MG capsule; Take 1 capsule (300 mg total) by mouth 3 (three) times daily. -     Insulin Pen Needle (TRUEPLUS 5-BEVEL PEN NEEDLES) 31G X 5 MM MISC; Use as instructed to inject into the skin once nightly.  Hyperlipidemia LDL goal <70 -     fenofibrate (TRICOR) 48 MG tablet; TAKE 1 TABLET (48 MG TOTAL) BY MOUTH ONCE DAILY. -     atorvastatin (LIPITOR) 40 MG tablet; TAKE 1 TABLET (40 MG TOTAL) BY MOUTH ONCE DAILY.  Essential hypertension -     lisinopril (ZESTRIL) 40 MG tablet; Take 1 tablet (40 mg total) by mouth once daily.    Patient has been counseled on age-appropriate routine health concerns for screening and prevention. These are reviewed and up-to-date. Referrals have been placed accordingly. Immunizations are up-to-date or declined.    Subjective:   Chief Complaint  Patient presents with   Hypertension   HPI Phyllis Kemp 51 y.o. female presents to office today for follow-up to diabetes and hypertension.  States her mom passed in March and she has not been taking any of her diabetes or blood pressure medications.  Notes increased grief with the death of her brother previously and now currently her mother.  She has a past medical history of  Diabetes mellitus type 2 with complications, uncontrolled (2003), Hyperlipidemia, and Hypertension (2003).    DM 2 Diabetes is poorly controlled.  She also endorses GI upset with taking Trulicity 1.5 mg weekly.  At this time we will decrease to 0.75 mg weekly.  We will resume metformin 1000 mg twice daily, Basaglar 15 units at bedtime, glimepiride 4 mg daily.  LDL is at goal with atorvastatin 40 mg daily and fenofibrate 48 mg daily Lab Results  Component Value Date   HGBA1C 12.2 (A) 03/23/2022    Lab Results  Component Value Date   HGBA1C 13.5 (A) 08/09/2021   Lab Results  Component Value Date   LDLCALC 50 11/21/2020    HTN Blood pressure is not at goal.  Again I would like to note that she has not been taking any of her medications for several months now.  We will resume lisinopril 40 mg daily at this time. BP Readings from Last 3 Encounters:  03/23/22 (!) 155/96  02/21/22 (!) 172/97  01/16/22 (!) 152/88     Review of Systems  Constitutional:  Negative for fever, malaise/fatigue and weight loss.  HENT: Negative.  Negative for nosebleeds.   Eyes: Negative.  Negative for blurred vision, double vision and photophobia.  Respiratory: Negative.  Negative for cough and shortness of breath.   Cardiovascular: Negative.  Negative for chest  pain, palpitations and leg swelling.  Gastrointestinal: Negative.  Negative for heartburn, nausea and vomiting.  Musculoskeletal: Negative.  Negative for myalgias.  Neurological: Negative.  Negative for dizziness, focal weakness, seizures and headaches.  Psychiatric/Behavioral:  Positive for depression. Negative for suicidal ideas.     Past Medical History:  Diagnosis Date   Diabetes mellitus type 2 with complications, uncontrolled 2003   Hyperlipidemia    Hypertension 2003    Past Surgical History:  Procedure Laterality Date   ABDOMINAL HYSTERECTOMY  2006   partial for fibroid tumors, still has cervix and ovaries    APPENDECTOMY  1985    COLPOSCOPY W/ BIOPSY / CURETTAGE  10/27/2019        Family History  Problem Relation Age of Onset   Diabetes Mother    Hypertension Mother    Cancer Maternal Aunt        breast    Depression Maternal Grandmother     Social History Reviewed with no changes to be made today.   Outpatient Medications Prior to Visit  Medication Sig Dispense Refill   acetaminophen (TYLENOL) 500 MG tablet Take 1 tablet (500 mg total) by mouth every 6 (six) hours as needed. 30 tablet 0   amLODipine (NORVASC) 10 MG tablet Take 1 tablet (10 mg total) by mouth once daily. 30 tablet 2   Blood Glucose Monitoring Suppl (TRUE METRIX METER) w/Device KIT Use as instructed 1 kit 0   ibuprofen (ADVIL) 600 MG tablet Take 1 tablet (600 mg total) by mouth every 6 (six) hours as needed. 30 tablet 0   lidocaine (XYLOCAINE) 2 % solution Use as directed 15 mLs in the mouth or throat as needed for mouth pain. 100 mL 0   Multiple Vitamins-Minerals (ONE-A-DAY WOMENS PO) Take 2 tablets by mouth daily. Chewable     naproxen sodium (ALEVE) 220 MG tablet Take 220 mg by mouth daily as needed (pain).     amoxicillin (AMOXIL) 500 MG capsule Take 1 capsule (500 mg total) by mouth 2 (two) times daily. 21 capsule 0   atorvastatin (LIPITOR) 40 MG tablet TAKE 1 TABLET (40 MG TOTAL) BY MOUTH DAILY. 90 tablet 2   Dulaglutide 1.5 MG/0.5ML SOPN INJECT 1.5 MG INTO THE SKIN ONCE A WEEK. (Patient taking differently: Inject 1.5 mg into the skin every 30 (thirty) days.) 2 mL 1   fenofibrate (TRICOR) 48 MG tablet TAKE 1 TABLET (48 MG TOTAL) BY MOUTH DAILY. (Patient not taking: Reported on 02/21/2022) 90 tablet 2   gabapentin (NEURONTIN) 300 MG capsule Take 1 capsule (300 mg total) by mouth 3 (three) times daily. (Patient taking differently: Take 300 mg by mouth 3 (three) times daily as needed (nerve pain).) 90 capsule 3   glimepiride (AMARYL) 4 MG tablet TAKE 2 TABLETS (8 MG TOTAL) BY MOUTH DAILY BEFORE BREAKFAST. (Patient not taking: Reported on  02/21/2022) 180 tablet 1   insulin glargine (LANTUS SOLOSTAR) 100 UNIT/ML Solostar Pen Inject 15 Units into the skin at bedtime. 15 mL 0   Insulin Pen Needle (TRUEPLUS 5-BEVEL PEN NEEDLES) 31G X 5 MM MISC Use as instructed. Inject into the skin once nightly. 100 each 6   lisinopril (ZESTRIL) 40 MG tablet Take 1 tablet (40 mg total) by mouth once daily. 30 tablet 2   metFORMIN (GLUCOPHAGE) 500 MG tablet TAKE 2 TABLETS (1,000 MG TOTAL) BY MOUTH 2 (TWO) TIMES DAILY WITH A MEAL. (Patient taking differently: Take 1,000 mg by mouth 2 (two) times daily with a meal.) 360 tablet 1  No facility-administered medications prior to visit.    No Known Allergies     Objective:    BP (!) 155/96   Pulse (!) 103   Wt 189 lb 12.8 oz (86.1 kg)   SpO2 97%   BMI 29.73 kg/m  Wt Readings from Last 3 Encounters:  03/23/22 189 lb 12.8 oz (86.1 kg)  11/27/21 189 lb (85.7 kg)  08/09/21 188 lb 6 oz (85.4 kg)    Physical Exam Vitals and nursing note reviewed.  Constitutional:      Appearance: She is well-developed.  HENT:     Head: Normocephalic and atraumatic.  Cardiovascular:     Rate and Rhythm: Regular rhythm. Tachycardia present.     Heart sounds: Normal heart sounds. No murmur heard.    No friction rub. No gallop.  Pulmonary:     Effort: Pulmonary effort is normal. No tachypnea or respiratory distress.     Breath sounds: Normal breath sounds. No decreased breath sounds, wheezing, rhonchi or rales.  Chest:     Chest wall: No tenderness.  Abdominal:     General: Bowel sounds are normal.     Palpations: Abdomen is soft.  Musculoskeletal:        General: Normal range of motion.     Cervical back: Normal range of motion.  Skin:    General: Skin is warm and dry.  Neurological:     Mental Status: She is alert and oriented to person, place, and time.     Coordination: Coordination normal.  Psychiatric:        Behavior: Behavior normal. Behavior is cooperative.        Thought Content: Thought  content normal.        Judgment: Judgment normal.          Patient has been counseled extensively about nutrition and exercise as well as the importance of adherence with medications and regular follow-up. The patient was given clear instructions to go to ER or return to medical center if symptoms don't improve, worsen or new problems develop. The patient verbalized understanding.   Follow-up: Return in about 6 weeks (around 05/04/2022) for meter check with luke and BP check/BMP.   Gildardo Pounds, FNP-BC [facility] and [place], [place]   03/23/2022, 1:17 PM

## 2022-03-29 ENCOUNTER — Other Ambulatory Visit: Payer: Self-pay

## 2022-03-30 ENCOUNTER — Other Ambulatory Visit: Payer: Self-pay

## 2022-04-11 ENCOUNTER — Other Ambulatory Visit: Payer: Self-pay

## 2022-04-17 ENCOUNTER — Other Ambulatory Visit: Payer: Self-pay

## 2022-05-04 ENCOUNTER — Ambulatory Visit: Payer: Self-pay | Admitting: Pharmacist

## 2022-05-04 NOTE — Progress Notes (Unsigned)
S:    Phyllis Kemp is a 51 y.o. female who presents for diabetes/HTN evaluation, education, and management.  PMH is significant for HTN, T2DM, HLD, alcohol abuse.  Patient was referred and last seen by Primary Care Provider, Bertram Denver, on 03/23/22.  At last visit, BP was 155/96, HR 103, A1c 12.2, BG 371. Patient reported not taking any medications since March. Decreased Trulicity to 0.75mg  weekly (patient reported GI upset at 1.5mg  dose) and restarted all other meds.   *hopefully pt will have home BG today, ask about adherence and how she's feeling, ask about the GI upset she experienced with Trulicity  Take BP - if high and pt is adherent - could possibly switch ACEi to ARB (valsartan), add HCTZ (in April Scr 0.6 but potassium 3.4)  Repeat lipid panel (last was 11/2020) DM - potentially increase trulicity, consider ozempic instead for GI upset? Adjust insulin - only at 15 units and 0.5 units/kg is around 43 units A1c too high for SGLT2i  Today, patient arrives in *** good spirits and presents without *** any assistance. ***  Patient reports diabetes and HTN were diagnosed in 2016.   Family/Social History: family hx of DM, HTN; nonsmoker, patient is self-pay   Current diabetes medications include: Trulicity 0.75mg  weekly, glimepiride 8mg  daily, insulin glargine (Basaglar Kwikpen) 15 units HS, metformin 1000mg  BID  Current hypertension medications include: amlodipine 10mg  daily, lisinopril 40mg  daily  Current hyperlipidemia medications include: atorvastatin 40mg  daily, fenofibrate 48mg  daily   Patient reports adherence to taking all medications as prescribed.  *** Patient denies adherence with medications, reports missing *** medications *** times per week, on average.  Do you feel that your medications are working for you? {YES NO:22349} Have you been experiencing any side effects to the medications prescribed? {YES Insurance coverage: None  Patient {Actions;  denies-reports:120008} hypoglycemic events.  Reported home fasting blood sugars: ***  Reported 2 hour post-meal/random blood sugars: ***.  Patient {Actions; denies-reports:120008} nocturia (nighttime urination).  Patient {Actions; denies-reports:120008} neuropathy (nerve pain). Patient {Actions; denies-reports:120008} visual changes. Patient {Actions; denies-reports:120008} self foot exams.   Patient reported dietary habits:   Patient-reported exercise habits: ***   O:   Lab Results  Component Value Date   HGBA1C 12.2 (A) 03/23/2022   There were no vitals filed for this visit.  Lipid Panel     Component Value Date/Time   CHOL 167 11/21/2020 0928   TRIG 225 (H) 11/21/2020 0928   HDL 82 11/21/2020 0928   CHOLHDL 2.0 11/21/2020 0928   CHOLHDL 3.4 10/18/2014 1516   VLDL NOT CALC 10/18/2014 1516   LDLCALC 50 11/21/2020 0928    Clinical Atherosclerotic Cardiovascular Disease (ASCVD): No  The 10-year ASCVD risk score (Arnett DK, et al., 2019) is: 9.2%   Values used to calculate the score:     Age: 39 years     Sex: Female     Is Non-Hispanic African American: Yes     Diabetic: Yes     Tobacco smoker: No     Systolic Blood Pressure: 155 mmHg     Is BP treated: Yes     HDL Cholesterol: 82 mg/dL     Total Cholesterol: 167 mg/dL   A/P: Diabetes longstanding *** currently ***. Patient is *** able to verbalize appropriate hypoglycemia management plan. Medication adherence appears ***. Control is suboptimal due to ***. -{Meds adjust:18428} basal insulin *** (insulin ***). Patient will continue to titrate 1 unit every *** days if fasting blood sugar >  100mg /dl until fasting blood sugars reach goal or next visit.  -{Meds adjust:18428} rapid insulin *** (insulin ***) to ***.  -{Meds adjust:18428} GLP-1 *** (generic ***) to ***.  -{Meds adjust:18428} SGLT2-I *** (generic ***) to ***. Counseled on sick day rules. -{Meds adjust:18428} metformin *** to ***.  -Patient educated on  purpose, proper use, and potential adverse effects of ***.  -Extensively discussed pathophysiology of diabetes, recommended lifestyle interventions, dietary effects on blood sugar control.  -Counseled on s/sx of and management of hypoglycemia.  -Next A1c anticipated ***.   ASCVD risk - primary prevention in patient with diabetes. Last LDL was 50 in 11/2020. Patient was at goal of <70 mg/dL. ASCVD risk factors include HTN, DM and 10-year ASCVD risk score of 9.2. high intensity statin indicated.  -Repeat lipid panel -Continued atorvastatin 40mg .   Hypertension longstanding *** currently ***. Blood pressure goal of <130/80 mmHg. Medication adherence ***. Blood pressure control is suboptimal due to ***. -***  Written patient instructions provided. Patient verbalized understanding of treatment plan.  Total time in face to face counseling *** minutes.    Follow-up:  Pharmacist ***. PCP clinic visit in ***.   Patient seen with  12/2020, PharmD Candidate [employer]

## 2022-05-12 ENCOUNTER — Observation Stay (HOSPITAL_COMMUNITY)
Admission: EM | Admit: 2022-05-12 | Discharge: 2022-05-13 | Disposition: A | Discharge status: Home / Self Care | Payer: Self-pay | Attending: Internal Medicine | Admitting: Internal Medicine

## 2022-05-12 ENCOUNTER — Other Ambulatory Visit: Payer: Self-pay

## 2022-05-12 ENCOUNTER — Emergency Department (HOSPITAL_COMMUNITY): Payer: Self-pay

## 2022-05-12 DIAGNOSIS — E119 Type 2 diabetes mellitus without complications: Secondary | ICD-10-CM

## 2022-05-12 DIAGNOSIS — Z79899 Other long term (current) drug therapy: Secondary | ICD-10-CM | POA: Insufficient documentation

## 2022-05-12 DIAGNOSIS — E1169 Type 2 diabetes mellitus with other specified complication: Secondary | ICD-10-CM | POA: Insufficient documentation

## 2022-05-12 DIAGNOSIS — I10 Essential (primary) hypertension: Secondary | ICD-10-CM | POA: Insufficient documentation

## 2022-05-12 DIAGNOSIS — T783XXA Angioneurotic edema, initial encounter: Principal | ICD-10-CM | POA: Insufficient documentation

## 2022-05-12 DIAGNOSIS — T464X5A Adverse effect of angiotensin-converting-enzyme inhibitors, initial encounter: Secondary | ICD-10-CM | POA: Insufficient documentation

## 2022-05-12 DIAGNOSIS — E876 Hypokalemia: Secondary | ICD-10-CM | POA: Insufficient documentation

## 2022-05-12 DIAGNOSIS — E785 Hyperlipidemia, unspecified: Secondary | ICD-10-CM | POA: Insufficient documentation

## 2022-05-12 DIAGNOSIS — Z794 Long term (current) use of insulin: Secondary | ICD-10-CM | POA: Insufficient documentation

## 2022-05-12 DIAGNOSIS — Z7985 Long-term (current) use of injectable non-insulin antidiabetic drugs: Secondary | ICD-10-CM | POA: Insufficient documentation

## 2022-05-12 DIAGNOSIS — Z7984 Long term (current) use of oral hypoglycemic drugs: Secondary | ICD-10-CM | POA: Insufficient documentation

## 2022-05-12 LAB — CBC WITH DIFFERENTIAL/PLATELET
Abs Immature Granulocytes: 0.03 10*3/uL (ref 0.00–0.07)
Basophils Absolute: 0 10*3/uL (ref 0.0–0.1)
Basophils Relative: 0 %
Eosinophils Absolute: 0.1 10*3/uL (ref 0.0–0.5)
Eosinophils Relative: 1 %
HCT: 35.6 % — ABNORMAL LOW (ref 36.0–46.0)
Hemoglobin: 11.8 g/dL — ABNORMAL LOW (ref 12.0–15.0)
Immature Granulocytes: 0 %
Lymphocytes Relative: 34 %
Lymphs Abs: 2.8 10*3/uL (ref 0.7–4.0)
MCH: 29.5 pg (ref 26.0–34.0)
MCHC: 33.1 g/dL (ref 30.0–36.0)
MCV: 89 fL (ref 80.0–100.0)
Monocytes Absolute: 0.6 10*3/uL (ref 0.1–1.0)
Monocytes Relative: 7 %
Neutro Abs: 4.7 10*3/uL (ref 1.7–7.7)
Neutrophils Relative %: 58 %
Platelets: 208 10*3/uL (ref 150–400)
RBC: 4 MIL/uL (ref 3.87–5.11)
RDW: 13.1 % (ref 11.5–15.5)
WBC: 8.1 10*3/uL (ref 4.0–10.5)
nRBC: 0 % (ref 0.0–0.2)

## 2022-05-12 LAB — COMPREHENSIVE METABOLIC PANEL
ALT: 40 U/L (ref 0–44)
AST: 40 U/L (ref 15–41)
Albumin: 3.6 g/dL (ref 3.5–5.0)
Alkaline Phosphatase: 54 U/L (ref 38–126)
Anion gap: 12 (ref 5–15)
BUN: 11 mg/dL (ref 6–20)
CO2: 21 mmol/L — ABNORMAL LOW (ref 22–32)
Calcium: 8.1 mg/dL — ABNORMAL LOW (ref 8.9–10.3)
Chloride: 103 mmol/L (ref 98–111)
Creatinine, Ser: 0.61 mg/dL (ref 0.44–1.00)
GFR, Estimated: >60 mL/min (ref >60)
Glucose, Bld: 238 mg/dL — ABNORMAL HIGH (ref 70–99)
Potassium: 3.3 mmol/L — ABNORMAL LOW (ref 3.5–5.1)
Sodium: 136 mmol/L (ref 135–145)
Total Bilirubin: 0.8 mg/dL (ref 0.3–1.2)
Total Protein: 6.6 g/dL (ref 6.5–8.1)

## 2022-05-12 LAB — I-STAT CHEM 8, ED
BUN: 11 mg/dL (ref 6–20)
Calcium, Ion: 0.98 mmol/L — ABNORMAL LOW (ref 1.15–1.40)
Chloride: 102 mmol/L (ref 98–111)
Creatinine, Ser: 0.8 mg/dL (ref 0.44–1.00)
Glucose, Bld: 239 mg/dL — ABNORMAL HIGH (ref 70–99)
HCT: 38 % (ref 36.0–46.0)
Hemoglobin: 12.9 g/dL (ref 12.0–15.0)
Potassium: 3.2 mmol/L — ABNORMAL LOW (ref 3.5–5.1)
Sodium: 137 mmol/L (ref 135–145)
TCO2: 22 mmol/L (ref 22–32)

## 2022-05-12 MED ORDER — ONDANSETRON HCL 4 MG/2ML IJ SOLN: 4.0000 mg | Freq: Four times a day (QID) | INTRAMUSCULAR | Status: DC | PRN

## 2022-05-12 MED ORDER — EPINEPHRINE 0.3 MG/0.3ML IJ SOAJ
INTRAMUSCULAR | Status: DC
Start: 2022-05-12 — End: 2022-05-12
  Filled 2022-05-12: qty 0.3

## 2022-05-12 MED ORDER — DEXAMETHASONE SODIUM PHOSPHATE 10 MG/ML IJ SOLN
10.0000 mg | Freq: Once | INTRAMUSCULAR | Status: AC
  Administered 2022-05-12: 10 mg via INTRAVENOUS
  Filled 2022-05-12: qty 1

## 2022-05-12 MED ORDER — ACETAMINOPHEN 325 MG PO TABS: 650.0000 mg | ORAL_TABLET | Freq: Four times a day (QID) | ORAL | Status: DC | PRN

## 2022-05-12 MED ORDER — INSULIN ASPART 100 UNIT/ML IJ SOLN
0.0000 [IU] | INTRAMUSCULAR | Status: DC
  Administered 2022-05-13: 8 [IU] via SUBCUTANEOUS
  Administered 2022-05-13: 5 [IU] via SUBCUTANEOUS
  Administered 2022-05-13 (×2): 11 [IU] via SUBCUTANEOUS

## 2022-05-12 MED ORDER — METHYLPREDNISOLONE SODIUM SUCC 125 MG IJ SOLR
INTRAMUSCULAR | Status: AC
Start: 2022-05-12 — End: 2022-05-12
  Administered 2022-05-12: 125 mg
  Filled 2022-05-12: qty 2

## 2022-05-12 MED ORDER — ACETAMINOPHEN 650 MG RE SUPP: 650.0000 mg | Freq: Four times a day (QID) | RECTAL | Status: DC | PRN

## 2022-05-12 MED ORDER — DIPHENHYDRAMINE HCL 50 MG/ML IJ SOLN: 25.0000 mg | Freq: Four times a day (QID) | INTRAMUSCULAR | Status: DC | PRN

## 2022-05-12 MED ORDER — IOHEXOL 300 MG/ML  SOLN
75.0000 mL | Freq: Once | INTRAMUSCULAR | Status: AC | PRN
  Administered 2022-05-12: 75 mL via INTRAVENOUS

## 2022-05-12 MED ORDER — SENNOSIDES-DOCUSATE SODIUM 8.6-50 MG PO TABS: 1.0000 | ORAL_TABLET | Freq: Every evening | ORAL | Status: DC | PRN

## 2022-05-12 MED ORDER — ONDANSETRON HCL 4 MG PO TABS: 4.0000 mg | ORAL_TABLET | Freq: Four times a day (QID) | ORAL | Status: DC | PRN

## 2022-05-12 MED ORDER — FAMOTIDINE IN NACL 20-0.9 MG/50ML-% IV SOLN
INTRAVENOUS | Status: AC
  Administered 2022-05-12: 20 mg
  Filled 2022-05-12: qty 50

## 2022-05-12 MED ORDER — ENOXAPARIN SODIUM 40 MG/0.4ML IJ SOSY
40.0000 mg | PREFILLED_SYRINGE | INTRAMUSCULAR | Status: DC
  Administered 2022-05-13: 40 mg via SUBCUTANEOUS
  Filled 2022-05-12: qty 0.4

## 2022-05-12 MED ORDER — POTASSIUM CHLORIDE 10 MEQ/100ML IV SOLN
10.0000 meq | INTRAVENOUS | Status: DC
  Filled 2022-05-12: qty 100

## 2022-05-12 MED ORDER — DIPHENHYDRAMINE HCL 50 MG/ML IJ SOLN
INTRAMUSCULAR | Status: AC
  Administered 2022-05-12: 50 mg
  Filled 2022-05-12: qty 1

## 2022-05-12 NOTE — ED Notes (Signed)
Patient transported to CT 

## 2022-05-12 NOTE — Assessment & Plan Note (Signed)
IV supplement ordered. 

## 2022-05-12 NOTE — H&P (Signed)
History and Physical    Sahily Biddle ATF:[id number] DOB: [birth year] DOA: 05/12/2022  PCP: Gildardo Pounds, NP  Patient coming from: Home  I have personally briefly reviewed patient's old medical records in [place]  Chief Complaint: Lower lip swelling  HPI: Phyllis Kemp is a 51 y.o. female with medical history significant for T2DM, HTN, HLD who presents to the ED for evaluation of acute lower lip swelling.  Patient reports new onset of lower lip swelling beginning around 4:30 PM on 8/5.  Initially swelling was only on one side but then spread across the entire lower lip.  She did not have any tongue swelling, difficulty swallowing foods or liquids, shortness of breath, or feeling of throat swelling.  She has been on lisinopril for hypertension.  She reports a similar episode years ago which resolved on its own.  She has not had any rash or skin changes.  Denies any recent medication changes.  ED Course  Labs/Imaging on admission: I have personally reviewed following labs and imaging studies.  Initial vitals showed BP 163/89, pulse 102, RR 20, temperature not recorded, SPO2 98% on room air.  Labs show sodium 136, potassium 3.3, bicarb 21, BUN 11, creatinine 0.61, serum glucose 238, LFTs within normal limits, WBC 8.1, hemoglobin 11.8, platelets 208,000.  CT soft tissue neck with contrast shows edema of the lower lip without fluid collection or other focal abnormality.  Patient was given IV Solu-Medrol 125 mg, IV Decadron 10 mg, IV Benadryl 50 mg, IV Pepcid 20 mg.  EDP discussed with on-call ENT who will come evaluate patient.  The hospitalist service was consulted to admit for further evaluation and management.  Review of Systems: All systems reviewed and are negative except as documented in history of present illness above.   Past Medical History:  Diagnosis Date   Diabetes mellitus type 2 with complications, uncontrolled 2003   Hyperlipidemia    Hypertension  2003    Past Surgical History:  Procedure Laterality Date   ABDOMINAL HYSTERECTOMY  2006   partial for fibroid tumors, still has cervix and ovaries    APPENDECTOMY  1985   COLPOSCOPY W/ BIOPSY / CURETTAGE  10/27/2019        Social History:  reports that she has never smoked. She has never used smokeless tobacco. She reports current alcohol use. She reports that she does not use drugs.  Allergies  Allergen Reactions   Lisinopril Swelling    Family History  Problem Relation Age of Onset   Diabetes Mother    Hypertension Mother    Cancer Maternal Aunt        breast    Depression Maternal Grandmother      Prior to Admission medications   Medication Sig Start Date End Date Taking? Authorizing Provider  acetaminophen (TYLENOL) 500 MG tablet Take 1 tablet (500 mg total) by mouth every 6 (six) hours as needed. 02/21/22   Redwine, Madison A, PA-C  amLODipine (NORVASC) 10 MG tablet Take 1 tablet (10 mg total) by mouth once daily. 03/01/22   Charlott Rakes, MD  atorvastatin (LIPITOR) 40 MG tablet TAKE 1 TABLET (40 MG TOTAL) BY MOUTH ONCE DAILY. 03/23/22   Gildardo Pounds, NP  Blood Glucose Monitoring Suppl (TRUE METRIX METER) w/Device KIT Use as instructed 10/25/20   Gildardo Pounds, NP  Dulaglutide (TRULICITY) 2.70 WC/3.7SE SOPN Inject 0.75 mg into the skin once a week. 03/23/22   Gildardo Pounds, NP  fenofibrate (TRICOR) 48 MG tablet  TAKE 1 TABLET (48 MG TOTAL) BY MOUTH ONCE DAILY. 03/23/22   Gildardo Pounds, NP  gabapentin (NEURONTIN) 300 MG capsule Take 1 capsule (300 mg total) by mouth 3 (three) times daily. 03/23/22   Gildardo Pounds, NP  glimepiride (AMARYL) 4 MG tablet TAKE 2 TABLETS (8 MG TOTAL) BY MOUTH DAILY BEFORE BREAKFAST. 03/23/22   Gildardo Pounds, NP  ibuprofen (ADVIL) 600 MG tablet Take 1 tablet (600 mg total) by mouth every 6 (six) hours as needed. 02/21/22   Redwine, Madison A, PA-C  Insulin Glargine (BASAGLAR KWIKPEN) 100 UNIT/ML Inject 15 Units into the skin once  nightly at bedtime. 03/23/22   Gildardo Pounds, NP  Insulin Pen Needle (TRUEPLUS 5-BEVEL PEN NEEDLES) 31G X 5 MM MISC Use as instructed to inject into the skin once nightly. 03/23/22   Gildardo Pounds, NP  lidocaine (XYLOCAINE) 2 % solution Use as directed 15 mLs in the mouth or throat as needed for mouth pain. 02/21/22   Redwine, Madison A, PA-C  lisinopril (ZESTRIL) 40 MG tablet Take 1 tablet (40 mg total) by mouth once daily. 03/23/22   Gildardo Pounds, NP  metFORMIN (GLUCOPHAGE) 500 MG tablet TAKE 2 TABLETS (1,000 MG TOTAL) BY MOUTH 2 (TWO) TIMES DAILY WITH A MEAL. 03/23/22   Gildardo Pounds, NP  Multiple Vitamins-Minerals (ONE-A-DAY WOMENS PO) Take 2 tablets by mouth daily. Chewable    [provider]  naproxen sodium (ALEVE) 220 MG tablet Take 220 mg by mouth daily as needed (pain).    [provider]    Physical Exam: Vitals:   05/12/22 2315 05/12/22 2330 05/12/22 2336 05/12/22 2345  BP: (!) 164/97 (!) 159/95  (!) 156/94  Pulse: 95 94  94  Resp: 15 14  15   Temp:   98.2 F (36.8 C)   TempSrc:   Oral   SpO2: 94% 95%  94%   Constitutional: NAD, calm, comfortable Eyes: EOMI, lids and conjunctivae normal ENMT: Significant swelling across entire lower lip.  No appreciable tongue or posterior pharynx swelling.  Normal phonation.  Handling secretions and protecting airway well. Neck: normal, supple, no masses. Respiratory: clear to auscultation bilaterally, no wheezing, no crackles. Normal respiratory effort. No accessory muscle use.  Cardiovascular: Regular rate and rhythm, no murmurs / rubs / gallops. No extremity edema. 2+ pedal pulses. Abdomen: no tenderness, no masses palpated. No hepatosplenomegaly. Bowel sounds positive.  Musculoskeletal: no clubbing / cyanosis. No joint deformity upper and lower extremities. Good ROM, no contractures. Normal muscle tone.  Skin: no rashes, lesions, ulcers. No induration Neurologic: Sensation intact. Strength 5/5 in all 4.   Psychiatric: Alert and oriented x 3. Normal mood.   EKG: Personally reviewed. Sinus rhythm, rate 98, no acute ischemic changes.  QTc 486.  Similar to prior.  Assessment/Plan Principal Problem:   Angioedema due to angiotensin converting enzyme inhibitor (ACE-I) Active Problems:   DM2 (diabetes mellitus, type 2) ([place])   Essential hypertension   Hyperlipidemia associated with type 2 diabetes mellitus ([place])   Hypokalemia   Mela Perham is a 51 y.o. female with medical history significant for T2DM, HTN, HLD who is admitted with ACE inhibitor induced angioedema.  Assessment and Plan: * Angioedema due to angiotensin converting enzyme inhibitor (ACE-I) Likely ACE inhibitor induced angioedema.  Swelling localized to lower lip.  Initially with abnormal phonation, now improved.  Handling secretions and protecting airway well. -Seen by ENT, no need for airway intervention, follow-up as needed -S/p IV Solu-Medrol, Decadron, Benadryl,  Pepcid -Monitor airway, redose steroids and/or antihistamines as needed -Lisinopril added as allergy and discontinued from med list.  Patient advised to discontinue as well. -Can likely DC to home tomorrow if no further progression of angioedema  Hypokalemia IV supplement ordered.  Hyperlipidemia associated with type 2 diabetes mellitus ([place]) Resume atorvastatin on discharge.  Essential hypertension Discontinue lisinopril which has been added to allergy list.  Continue amlodipine on discharge.  DM2 (diabetes mellitus, type 2) ([place]) Hyperglycemic after receiving steroids.  Place on SSI.  DVT prophylaxis: enoxaparin (LOVENOX) injection 40 mg Start: 05/12/22 2345 Code Status: Full code Family Communication: Friend at bedside Disposition Plan: From home and likely discharge to home pending clinical progress Consults called: ENT, Dr. Constance Holster Severity of Illness: The appropriate patient status for this patient is OBSERVATION. Observation status is judged  to be reasonable and necessary in order to provide the required intensity of service to ensure the patient's safety. The patient's presenting symptoms, physical exam findings, and initial radiographic and laboratory data in the context of their medical condition is felt to place them at decreased risk for further clinical deterioration. Furthermore, it is anticipated that the patient will be medically stable for discharge from the hospital within 2 midnights of admission.   Zada Finders MD [facility]  If 7PM-7AM, please contact night-coverage [email or web address]  05/12/2022, 11:52 PM

## 2022-05-12 NOTE — Assessment & Plan Note (Signed)
Resume atorvastatin on discharge.

## 2022-05-12 NOTE — ED Provider Notes (Signed)
[facility] EMERGENCY DEPARTMENT Provider Note   CSN: [id number] Arrival date & time: 05/12/22  2051     History  Chief Complaint  Patient presents with   Facial Swelling   Shortness of Breath    Phyllis Kemp is a 51 y.o. female.  HPI  Patient with medical history of hypertension, hyperlipidemia, type 2 diabetes presents today due to facial swelling.  Patient states around 4:30 PM 5 hours prior to arrival she started having swelling in her lower lip.  This spread across the lip.  Denies any difficulty swallowing, felt a little short of breath.  She is on lisinopril for hypertension.  No history of angioedema or family history of angioedema.  Denies any new products, medicines.  Not having any difficulty swallowing denies any tingling the back of her throat, rashes, chest pain.  Home Medications Prior to Admission medications   Medication Sig Start Date End Date Taking? Authorizing Provider  acetaminophen (TYLENOL) 500 MG tablet Take 1 tablet (500 mg total) by mouth every 6 (six) hours as needed. 02/21/22   Redwine, Madison A, PA-C  amLODipine (NORVASC) 10 MG tablet Take 1 tablet (10 mg total) by mouth once daily. 03/01/22   Charlott Rakes, MD  atorvastatin (LIPITOR) 40 MG tablet TAKE 1 TABLET (40 MG TOTAL) BY MOUTH ONCE DAILY. 03/23/22   Gildardo Pounds, NP  Blood Glucose Monitoring Suppl (TRUE METRIX METER) w/Device KIT Use as instructed 10/25/20   Gildardo Pounds, NP  Dulaglutide (TRULICITY) 7.71 HA/5.7XU SOPN Inject 0.75 mg into the skin once a week. 03/23/22   Gildardo Pounds, NP  fenofibrate (TRICOR) 48 MG tablet TAKE 1 TABLET (48 MG TOTAL) BY MOUTH ONCE DAILY. 03/23/22   Gildardo Pounds, NP  gabapentin (NEURONTIN) 300 MG capsule Take 1 capsule (300 mg total) by mouth 3 (three) times daily. 03/23/22   Gildardo Pounds, NP  glimepiride (AMARYL) 4 MG tablet TAKE 2 TABLETS (8 MG TOTAL) BY MOUTH DAILY BEFORE BREAKFAST. 03/23/22   Gildardo Pounds, NP  ibuprofen  (ADVIL) 600 MG tablet Take 1 tablet (600 mg total) by mouth every 6 (six) hours as needed. 02/21/22   Redwine, Madison A, PA-C  Insulin Glargine (BASAGLAR KWIKPEN) 100 UNIT/ML Inject 15 Units into the skin once nightly at bedtime. 03/23/22   Gildardo Pounds, NP  Insulin Pen Needle (TRUEPLUS 5-BEVEL PEN NEEDLES) 31G X 5 MM MISC Use as instructed to inject into the skin once nightly. 03/23/22   Gildardo Pounds, NP  lidocaine (XYLOCAINE) 2 % solution Use as directed 15 mLs in the mouth or throat as needed for mouth pain. 02/21/22   Redwine, Madison A, PA-C  lisinopril (ZESTRIL) 40 MG tablet Take 1 tablet (40 mg total) by mouth once daily. 03/23/22   Gildardo Pounds, NP  metFORMIN (GLUCOPHAGE) 500 MG tablet TAKE 2 TABLETS (1,000 MG TOTAL) BY MOUTH 2 (TWO) TIMES DAILY WITH A MEAL. 03/23/22   Gildardo Pounds, NP  Multiple Vitamins-Minerals (ONE-A-DAY WOMENS PO) Take 2 tablets by mouth daily. Chewable    [provider]  naproxen sodium (ALEVE) 220 MG tablet Take 220 mg by mouth daily as needed (pain).    [provider]      Allergies    Patient has no known allergies.    Review of Systems   Review of Systems  Physical Exam Updated Vital Signs BP (!) 153/90   Pulse (!) 101   Resp (!) 21   SpO2 94%  Physical Exam Vitals and nursing note reviewed. Exam conducted with a chaperone present.  Constitutional:      General: She is not in acute distress.    Appearance: Normal appearance.  HENT:     Head: Normocephalic and atraumatic.     Mouth/Throat:     Comments: Lower lip swelling, tongue swelling. Handling secretions but abnormal phonation - hot potato voice  Eyes:     General: No scleral icterus.       Right eye: No discharge.        Left eye: No discharge.     Extraocular Movements: Extraocular movements intact.     Pupils: Pupils are equal, round, and reactive to light.  Cardiovascular:     Rate and Rhythm: Normal rate and regular rhythm.     Pulses: Normal pulses.      Heart sounds: Normal heart sounds. No murmur heard.    No friction rub. No gallop.  Pulmonary:     Effort: Pulmonary effort is normal. No respiratory distress.     Breath sounds: Normal breath sounds.  Abdominal:     General: Abdomen is flat. Bowel sounds are normal. There is no distension.     Palpations: Abdomen is soft.     Tenderness: There is no abdominal tenderness.  Skin:    General: Skin is warm and dry.     Coloration: Skin is not jaundiced.  Neurological:     Mental Status: She is alert. Mental status is at baseline.     Coordination: Coordination normal.     ED Results / Procedures / Treatments   Labs (all labs ordered are listed, but only abnormal results are displayed) Labs Reviewed  CBC WITH DIFFERENTIAL/PLATELET - Abnormal; Notable for the following components:      Result Value   Hemoglobin 11.8 (*)    HCT 35.6 (*)    All other components within normal limits  COMPREHENSIVE METABOLIC PANEL - Abnormal; Notable for the following components:   Potassium 3.3 (*)    CO2 21 (*)    Glucose, Bld 238 (*)    Calcium 8.1 (*)    All other components within normal limits  I-STAT CHEM 8, ED - Abnormal; Notable for the following components:   Potassium 3.2 (*)    Glucose, Bld 239 (*)    Calcium, Ion 0.98 (*)    All other components within normal limits    EKG EKG Interpretation  Date/Time:  Saturday May 12 2022 21:04:58 EDT Ventricular Rate:  98 PR Interval:  141 QRS Duration: 88 QT Interval:  380 QTC Calculation: 486 R Axis:   -24 Text Interpretation: Sinus rhythm Borderline left axis deviation Borderline prolonged QT interval Baseline wander in lead(s) V2 No significant change since last tracing Confirmed by Wandra Arthurs [phone]) on 05/12/2022 9:21:52 PM  Radiology CT Soft Tissue Neck W Contrast  Result Date: 05/12/2022 CLINICAL DATA:  Lip swelling EXAM: CT NECK WITH CONTRAST TECHNIQUE: Multidetector CT imaging of the neck was performed using the standard  protocol following the bolus administration of intravenous contrast. RADIATION DOSE REDUCTION: This exam was performed according to the departmental dose-optimization program which includes automated exposure control, adjustment of the mA and/or kV according to patient size and/or use of iterative reconstruction technique. CONTRAST:  66m OMNIPAQUE IOHEXOL 300 MG/ML  SOLN COMPARISON:  None Available. FINDINGS: PHARYNX AND LARYNX: The nasopharynx, oropharynx and larynx are normal. Visible portions of the oral cavity, tongue base and floor of mouth are normal. Normal epiglottis,  vallecula and pyriform sinuses. The larynx is normal. No retropharyngeal abscess, effusion or lymphadenopathy. SALIVARY GLANDS: Normal parotid, submandibular and sublingual glands. THYROID: Normal. LYMPH NODES: No enlarged or abnormal density lymph nodes. VASCULAR: Major cervical vessels are patent. LIMITED INTRACRANIAL: Normal. VISUALIZED ORBITS: Normal. MASTOIDS AND VISUALIZED PARANASAL SINUSES: No fluid levels or advanced mucosal thickening. No mastoid effusion. SKELETON: No bony spinal canal stenosis. No lytic or blastic lesions. UPPER CHEST: Clear. OTHER: There is edema of the lower lip without fluid collection or other focal abnormality. IMPRESSION: Edema of the lower lip without fluid collection or other focal abnormality. Electronically Signed   By: Ulyses Jarred M.D.   On: 05/12/2022 22:39    Procedures Procedures    Medications Ordered in ED Medications  methylPREDNISolone sodium succinate (SOLU-MEDROL) 125 mg/2 mL injection (125 mg  Given 05/12/22 2113)  diphenhydrAMINE (BENADRYL) 50 MG/ML injection (50 mg  Given 05/12/22 2113)  famotidine (PEPCID) 20-0.9 MG/50ML-% IVPB (0 mg  Stopped 05/12/22 2152)  dexamethasone (DECADRON) injection 10 mg (10 mg Intravenous Given 05/12/22 2238)  iohexol (OMNIPAQUE) 300 MG/ML solution 75 mL (75 mLs Intravenous Contrast Given 05/12/22 2213)    ED Course/ Medical Decision Making/ A&P Clinical  Course as of 05/12/22 2259  Sat May 12, 2022  2206 I called CT, they are bring patient back next for CT soft tissue. [HS]  2233 Patient back from CT. Voice changes are still present and worsening.  [HS]    Clinical Course User Index [HS] Sherrill Raring, PA-C                           Medical Decision Making Amount and/or Complexity of Data Reviewed Labs: ordered. Radiology: ordered.  Risk Prescription drug management. Decision regarding hospitalization.   Patient presents due to facial swelling.  Differential includes not related to anaphylaxis, angioedema, allergic reaction.  Given patient is on lisinopril I think this is most likely ACE inhibitor induced angioedema.  I am concerned about her phonation and oral swelling, labs along CT soft tissue ordered.   I ordered and reviewed laboratory work-up.  Primary dictation no leukocytosis mild anemia with hgb 11.8 .  I ordered Solu-Medrol, Pepcid, Benadryl.  Epinephrine not indicated.  Additional Decadron ordered due to worsening oral swelling.    CT scan is notable for lower lip edema.  Patient is on lisinopril and presents in angioedema likely secondary to the ACE inhibitor's.  She is protecting her airway but unable to visualize posterior pharynx.  Her voice is hoarse, palpitated voice on exam.  CT soft tissue shows lower lip edema, but I am concerned that patient's phonation is worsening despite steroids.  I will consult ENT.  I consulted with Dr. Constance Holster with ENT.  They are going to come to the ED and evaluate the patient.   I spoke with Dr. Posey Pronto with hospitalist service, he will admit the patient.        Final Clinical Impression(s) / ED Diagnoses Final diagnoses:  Angioedema, initial encounter    Rx / DC Orders ED Discharge Orders     None         Sherrill Raring, Hershal Coria 05/12/22 2259    Drenda Freeze, MD 05/13/22 [phone]

## 2022-05-12 NOTE — ED Triage Notes (Addendum)
Pt arrives for bottom lip swelling that started at approx 1630 with just swelling at one side of the mouth, but has progressed to the entire bottom lip. Pt denies allergies, states she has been on lisinopril for years. Pt reports she had 1 episode of this years ago that just went away on his own. Pt denies throat tightening, tongue swelling. Pt reports some shob. NAD noted, speaking in full sentences.

## 2022-05-12 NOTE — Assessment & Plan Note (Signed)
Discontinue lisinopril which has been added to allergy list.  Continue amlodipine on discharge.

## 2022-05-12 NOTE — Hospital Course (Signed)
Phyllis Kemp is a 51 y.o. female with medical history significant for T2DM, HTN, HLD who is admitted with ACE inhibitor induced angioedema.

## 2022-05-12 NOTE — Assessment & Plan Note (Signed)
Likely ACE inhibitor induced angioedema.  Swelling localized to lower lip.  Initially with abnormal phonation, now improved.  Handling secretions and protecting airway well. -Seen by ENT, no need for airway intervention, follow-up as needed -S/p IV Solu-Medrol, Decadron, Benadryl, Pepcid -Monitor airway, redose steroids and/or antihistamines as needed -Lisinopril added as allergy and discontinued from med list.  Patient advised to discontinue as well. -Can likely DC to home tomorrow if no further progression of angioedema

## 2022-05-12 NOTE — Consult Note (Signed)
Reason for Consult: Angioedema Referring Physician: Drenda Freeze, MD  Phyllis Kemp is an 51 y.o. female.  HPI: Before for this afternoon she started developing swelling of the lower lip.  She is on an ACE inhibitor and has been on it for few years.  She did not have any difficulty breathing or swallowing.  She feels now that the swelling has not progressed any further since she has been here.  Past Medical History:  Diagnosis Date   Diabetes mellitus type 2 with complications, uncontrolled 2003   Hyperlipidemia    Hypertension 2003    Past Surgical History:  Procedure Laterality Date   ABDOMINAL HYSTERECTOMY  2006   partial for fibroid tumors, still has cervix and ovaries    APPENDECTOMY  1985   COLPOSCOPY W/ BIOPSY / CURETTAGE  10/27/2019        Family History  Problem Relation Age of Onset   Diabetes Mother    Hypertension Mother    Cancer Maternal Aunt        breast    Depression Maternal Grandmother     Social History:  reports that she has never smoked. She has never used smokeless tobacco. She reports current alcohol use. She reports that she does not use drugs.  Allergies: No Known Allergies  Medications: Reviewed  Results for orders placed or performed during the hospital encounter of 05/12/22 (from the past 48 hour(s))  CBC with Differential     Status: Abnormal   Collection Time: 05/12/22  9:25 PM  Result Value Ref Range   WBC 8.1 4.0 - 10.5 K/uL   RBC 4.00 3.87 - 5.11 MIL/uL   Hemoglobin 11.8 (L) 12.0 - 15.0 g/dL   HCT 35.6 (L) 36.0 - 46.0 %   MCV 89.0 80.0 - 100.0 fL   MCH 29.5 26.0 - 34.0 pg   MCHC 33.1 30.0 - 36.0 g/dL   RDW 13.1 11.5 - 15.5 %   Platelets 208 150 - 400 K/uL   nRBC 0.0 0.0 - 0.2 %   Neutrophils Relative % 58 %   Neutro Abs 4.7 1.7 - 7.7 K/uL   Lymphocytes Relative 34 %   Lymphs Abs 2.8 0.7 - 4.0 K/uL   Monocytes Relative 7 %   Monocytes Absolute 0.6 0.1 - 1.0 K/uL   Eosinophils Relative 1 %   Eosinophils Absolute 0.1  0.0 - 0.5 K/uL   Basophils Relative 0 %   Basophils Absolute 0.0 0.0 - 0.1 K/uL   Immature Granulocytes 0 %   Abs Immature Granulocytes 0.03 0.00 - 0.07 K/uL    Comment: Performed at [facility], [address] [place], [place] [address]  Comprehensive metabolic panel     Status: Abnormal   Collection Time: 05/12/22  9:25 PM  Result Value Ref Range   Sodium 136 135 - 145 mmol/L   Potassium 3.3 (L) 3.5 - 5.1 mmol/L   Chloride 103 98 - 111 mmol/L   CO2 21 (L) 22 - 32 mmol/L   Glucose, Bld 238 (H) 70 - 99 mg/dL    Comment: Glucose reference range applies only to samples taken after fasting for at least 8 hours.   BUN 11 6 - 20 mg/dL   Creatinine, Ser 0.61 0.44 - 1.00 mg/dL   Calcium 8.1 (L) 8.9 - 10.3 mg/dL   Total Protein 6.6 6.5 - 8.1 g/dL   Albumin 3.6 3.5 - 5.0 g/dL   AST 40 15 - 41 U/L   ALT 40  0 - 44 U/L   Alkaline Phosphatase 54 38 - 126 U/L   Total Bilirubin 0.8 0.3 - 1.2 mg/dL   GFR, Estimated >62 >69 mL/min    Comment: (NOTE) Calculated using the CKD-EPI Creatinine Equation (2021)    Anion gap 12 5 - 15    Comment: Performed at [facility], [address] [place], [place] [address]  I-stat chem 8, ED (not at [facility] or [facility])     Status: Abnormal   Collection Time: 05/12/22  9:40 PM  Result Value Ref Range   Sodium 137 135 - 145 mmol/L   Potassium 3.2 (L) 3.5 - 5.1 mmol/L   Chloride 102 98 - 111 mmol/L   BUN 11 6 - 20 mg/dL   Creatinine, Ser 2.70 0.44 - 1.00 mg/dL   Glucose, Bld 350 (H) 70 - 99 mg/dL    Comment: Glucose reference range applies only to samples taken after fasting for at least 8 hours.   Calcium, Ion 0.98 (L) 1.15 - 1.40 mmol/L   TCO2 22 22 - 32 mmol/L   Hemoglobin 12.9 12.0 - 15.0 g/dL   HCT 09.3 81.8 - 29.9 %    CT Soft Tissue Neck W Contrast  Result Date: 05/12/2022 CLINICAL DATA:  Lip swelling EXAM: CT NECK WITH CONTRAST TECHNIQUE: Multidetector CT imaging of the neck was performed using the standard protocol following the bolus  administration of intravenous contrast. RADIATION DOSE REDUCTION: This exam was performed according to the departmental dose-optimization program which includes automated exposure control, adjustment of the mA and/or kV according to patient size and/or use of iterative reconstruction technique. CONTRAST:  43mL OMNIPAQUE IOHEXOL 300 MG/ML  SOLN COMPARISON:  None Available. FINDINGS: PHARYNX AND LARYNX: The nasopharynx, oropharynx and larynx are normal. Visible portions of the oral cavity, tongue base and floor of mouth are normal. Normal epiglottis, vallecula and pyriform sinuses. The larynx is normal. No retropharyngeal abscess, effusion or lymphadenopathy. SALIVARY GLANDS: Normal parotid, submandibular and sublingual glands. THYROID: Normal. LYMPH NODES: No enlarged or abnormal density lymph nodes. VASCULAR: Major cervical vessels are patent. LIMITED INTRACRANIAL: Normal. VISUALIZED ORBITS: Normal. MASTOIDS AND VISUALIZED PARANASAL SINUSES: No fluid levels or advanced mucosal thickening. No mastoid effusion. SKELETON: No bony spinal canal stenosis. No lytic or blastic lesions. UPPER CHEST: Clear. OTHER: There is edema of the lower lip without fluid collection or other focal abnormality. IMPRESSION: Edema of the lower lip without fluid collection or other focal abnormality. Electronically Signed   By: Deatra Robinson M.D.   On: 05/12/2022 22:39    BZJ:IRCVELFY except as listed in admit H&P  Blood pressure (!) 153/90, pulse (!) 101, resp. rate (!) 21, SpO2 94 %.  PHYSICAL EXAM: Overall appearance:  Healthy appearing, in no distress Head:  Normocephalic, atraumatic. Ears: External ears look healthy. Nose: External nose is healthy in appearance. Internal nasal exam free of any lesions or obstruction. Oral Cavity/Pharynx: The lower lip is diffusely edematous.  There is no other edema throughout the oral cavity.  Tongue and floor of mouth are clear.  Pharynx is clear as well.  There are no mucosal lesions or  masses identified. Larynx/Hypopharynx: Deferred Neuro:  No identifiable neurologic deficits. Neck: No palpable neck masses.  Studies Reviewed: CT neck reviewed.  Procedures: none   Assessment/Plan: Angioedema likely ACE inhibitor related.  Treat medically here in the emergency department or admit to medical service if necessary.  No need for airway intervention.  Follow-up as needed with me.  T78.3XXA  Medical Decision  Making: #/Complex Problems: 3  Data Reviewed:2  Management:3 (1-Straightforward, 2-Low, 3-Moderate, 4-High)   Serena Colonel 05/12/2022, 11:12 PM

## 2022-05-12 NOTE — Assessment & Plan Note (Signed)
Hyperglycemic after receiving steroids.  Place on SSI.

## 2022-05-13 ENCOUNTER — Encounter (HOSPITAL_COMMUNITY): Payer: Self-pay | Admitting: Internal Medicine

## 2022-05-13 DIAGNOSIS — E1169 Type 2 diabetes mellitus with other specified complication: Secondary | ICD-10-CM

## 2022-05-13 DIAGNOSIS — Z794 Long term (current) use of insulin: Secondary | ICD-10-CM

## 2022-05-13 DIAGNOSIS — I10 Essential (primary) hypertension: Secondary | ICD-10-CM

## 2022-05-13 DIAGNOSIS — E1142 Type 2 diabetes mellitus with diabetic polyneuropathy: Secondary | ICD-10-CM

## 2022-05-13 DIAGNOSIS — E876 Hypokalemia: Secondary | ICD-10-CM

## 2022-05-13 DIAGNOSIS — E785 Hyperlipidemia, unspecified: Secondary | ICD-10-CM

## 2022-05-13 LAB — CBC
HCT: 35.1 % — ABNORMAL LOW (ref 36.0–46.0)
Hemoglobin: 11.8 g/dL — ABNORMAL LOW (ref 12.0–15.0)
MCH: 29.4 pg (ref 26.0–34.0)
MCHC: 33.6 g/dL (ref 30.0–36.0)
MCV: 87.3 fL (ref 80.0–100.0)
Platelets: 199 10*3/uL (ref 150–400)
RBC: 4.02 MIL/uL (ref 3.87–5.11)
RDW: 13.2 % (ref 11.5–15.5)
WBC: 6.4 10*3/uL (ref 4.0–10.5)
nRBC: 0 % (ref 0.0–0.2)

## 2022-05-13 LAB — BASIC METABOLIC PANEL
Anion gap: 9 (ref 5–15)
BUN: 9 mg/dL (ref 6–20)
CO2: 22 mmol/L (ref 22–32)
Calcium: 8 mg/dL — ABNORMAL LOW (ref 8.9–10.3)
Chloride: 106 mmol/L (ref 98–111)
Creatinine, Ser: 0.66 mg/dL (ref 0.44–1.00)
GFR, Estimated: >60 mL/min (ref >60)
Glucose, Bld: 280 mg/dL — ABNORMAL HIGH (ref 70–99)
Potassium: 3.5 mmol/L (ref 3.5–5.1)
Sodium: 137 mmol/L (ref 135–145)

## 2022-05-13 LAB — GLUCOSE, CAPILLARY
Glucose-Capillary: 225 mg/dL — ABNORMAL HIGH (ref 70–99)
Glucose-Capillary: 271 mg/dL — ABNORMAL HIGH (ref 70–99)
Glucose-Capillary: 310 mg/dL — ABNORMAL HIGH (ref 70–99)
Glucose-Capillary: 333 mg/dL — ABNORMAL HIGH (ref 70–99)

## 2022-05-13 LAB — HIV ANTIBODY (ROUTINE TESTING W REFLEX): HIV Screen 4th Generation wRfx: NONREACTIVE

## 2022-05-13 MED ORDER — AMLODIPINE BESYLATE 10 MG PO TABS
10.0000 mg | ORAL_TABLET | Freq: Every day | ORAL | Status: DC
  Administered 2022-05-13: 10 mg via ORAL
  Filled 2022-05-13: qty 1

## 2022-05-13 MED ORDER — PREDNISONE 10 MG (21) PO TBPK
ORAL_TABLET | ORAL | 0 refills | Status: DC
  Filled 2022-05-13: qty 21, 6d supply, fill #0

## 2022-05-13 MED ORDER — POTASSIUM CHLORIDE 10 MEQ/100ML IV SOLN
10.0000 meq | INTRAVENOUS | Status: AC
  Administered 2022-05-13 (×3): 10 meq via INTRAVENOUS
  Filled 2022-05-13 (×2): qty 100

## 2022-05-13 MED ORDER — AMLODIPINE BESYLATE 10 MG PO TABS
10.0000 mg | ORAL_TABLET | Freq: Every day | ORAL | 0 refills | Status: DC
  Filled 2022-05-13: qty 30, 30d supply, fill #0

## 2022-05-13 NOTE — Discharge Summary (Signed)
70Physician Discharge Summary  Phyllis Kemp CWC:[id number] DOB: 1971/06/26 DOA: 05/12/2022  PCP: Gildardo Pounds, NP  Admit date: 05/12/2022 Discharge date: 05/13/2022  Admitted From: Inpatient Disposition: home  Recommendations for Outpatient Follow-up:  Follow up with PCP in 1-2 weeks   Home Health:No Equipment/Devices:no new equipment  Discharge Condition:Stable CODE STATUS:Full code Diet recommendation: Diabetic diet  Brief/Interim Summary: This is a 51 year old female with active medical history to include type 2 diabetes, hypertension, and hyperlipidemia to ER for evaluation of acute bowel with swallowing.  Patient reportedly came on approximately 4:30 PM prior to admission did not have any tongue swelling, difficulty swallowing with foods or liquids or shortness of breath.  Patient is on lisinopril for hypertension reported similar episode many years ago.  Because of concerns of angioedema secondary lisinopril lisinopril was stopped and patient was admitted for further eval and treatment.  Patient was treated with steroids and Benadryl.  She rapidly improved and is at baseline this morning.  She was seen by ENT in consultation with diagnosed consistent with ACE inhibitor induced angioedema.  ACE inhibitor has been stopped.  Restarted patient on her outpatient amlodipine which she has not taken in over a month.  She will follow-up with the PCP, she was also given a Deltasone Pred pack to complete.  Otherwise patient continue her home medications will need close surveillance with her PCP for medication compliance and continued diabetic surveillance of lipids A1c and blood pressure.  Discharge Diagnoses:  Principal Problem:   Angioedema due to angiotensin converting enzyme inhibitor (ACE-I) Active Problems:   DM2 (diabetes mellitus, type 2) (HCC)   Essential hypertension   Hyperlipidemia associated with type 2 diabetes mellitus ([place])   Hypokalemia    Discharge  Instructions  Discharge Instructions     Diet - low sodium heart healthy   Complete by: As directed    Increase activity slowly   Complete by: As directed       Allergies as of 05/13/2022       Reactions   Lisinopril Swelling   Swelling of lips         Medication List     STOP taking these medications    ibuprofen 600 MG tablet Commonly known as: ADVIL       TAKE these medications    acetaminophen 500 MG tablet Commonly known as: TYLENOL Take 1 tablet (500 mg total) by mouth every 6 (six) hours as needed.   amLODipine 10 MG tablet Commonly known as: NORVASC Take 1 tablet (10 mg total) by mouth once daily.   atorvastatin 40 MG tablet Commonly known as: LIPITOR TAKE 1 TABLET (40 MG TOTAL) BY MOUTH ONCE DAILY. What changed: how much to take   insulin glargine 100 UNIT/ML injection Commonly known as: LANTUS Inject 15 Units into the skin daily.   Basaglar KwikPen 100 UNIT/ML Inject 15 Units into the skin once nightly at bedtime.   fenofibrate 48 MG tablet Commonly known as: TRICOR TAKE 1 TABLET (48 MG TOTAL) BY MOUTH ONCE DAILY. What changed:  how much to take when to take this   gabapentin 300 MG capsule Commonly known as: NEURONTIN Take 1 capsule (300 mg total) by mouth 3 (three) times daily. What changed:  when to take this reasons to take this   glimepiride 4 MG tablet Commonly known as: AMARYL TAKE 2 TABLETS (8 MG TOTAL) BY MOUTH DAILY BEFORE BREAKFAST. What changed:  how much to take when to take this   lidocaine 2 %  solution Commonly known as: XYLOCAINE Use as directed 15 mLs in the mouth or throat as needed for mouth pain.   metFORMIN 500 MG tablet Commonly known as: GLUCOPHAGE TAKE 2 TABLETS (1,000 MG TOTAL) BY MOUTH 2 (TWO) TIMES DAILY WITH A MEAL. What changed: how much to take   naproxen sodium 220 MG tablet Commonly known as: ALEVE Take 220 mg by mouth daily as needed (pain).   ONE-A-DAY WOMENS PO Take 2 tablets by mouth  daily. Chewable   predniSONE 10 MG (21) Tbpk tablet Commonly known as: STERAPRED UNI-PAK 21 TAB Take per package insert   True Metrix Meter w/Device Kit Use as instructed   TRUEplus 5-Bevel Pen Needles 31G X 5 MM Misc Generic drug: Insulin Pen Needle Use as instructed to inject into the skin once nightly.   Trulicity 8.46 KZ/9.9JT Sopn Generic drug: Dulaglutide Inject 0.75 mg into the skin once a week.        Allergies  Allergen Reactions   Lisinopril Swelling    Swelling of lips     Consultations: ent   Procedures/Studies: CT Soft Tissue Neck W Contrast  Result Date: 05/12/2022 CLINICAL DATA:  Lip swelling EXAM: CT NECK WITH CONTRAST TECHNIQUE: Multidetector CT imaging of the neck was performed using the standard protocol following the bolus administration of intravenous contrast. RADIATION DOSE REDUCTION: This exam was performed according to the departmental dose-optimization program which includes automated exposure control, adjustment of the mA and/or kV according to patient size and/or use of iterative reconstruction technique. CONTRAST:  9m OMNIPAQUE IOHEXOL 300 MG/ML  SOLN COMPARISON:  None Available. FINDINGS: PHARYNX AND LARYNX: The nasopharynx, oropharynx and larynx are normal. Visible portions of the oral cavity, tongue base and floor of mouth are normal. Normal epiglottis, vallecula and pyriform sinuses. The larynx is normal. No retropharyngeal abscess, effusion or lymphadenopathy. SALIVARY GLANDS: Normal parotid, submandibular and sublingual glands. THYROID: Normal. LYMPH NODES: No enlarged or abnormal density lymph nodes. VASCULAR: Major cervical vessels are patent. LIMITED INTRACRANIAL: Normal. VISUALIZED ORBITS: Normal. MASTOIDS AND VISUALIZED PARANASAL SINUSES: No fluid levels or advanced mucosal thickening. No mastoid effusion. SKELETON: No bony spinal canal stenosis. No lytic or blastic lesions. UPPER CHEST: Clear. OTHER: There is edema of the lower lip without  fluid collection or other focal abnormality. IMPRESSION: Edema of the lower lip without fluid collection or other focal abnormality. Electronically Signed   By: KUlyses JarredM.D.   On: 05/12/2022 22:39      Subjective: Patient reports he feels much better reports he is at baseline requesting discharge home.  Discharge Exam: Vitals:   05/13/22 0443 05/13/22 0825  BP: (!) 145/79 (!) 183/96  Pulse: 95 86  Resp: 19 16  Temp: 98.4 F (36.9 C) 98.7 F (37.1 C)  SpO2: 97% 93%   Vitals:   05/13/22 0041 05/13/22 0047 05/13/22 0443 05/13/22 0825  BP: (!) 149/90  (!) 145/79 (!) 183/96  Pulse: 94  95 86  Resp: 18  19 16   Temp: 98.4 F (36.9 C) 98.4 F (36.9 C) 98.4 F (36.9 C) 98.7 F (37.1 C)  TempSrc: Oral Oral Oral Oral  SpO2: 100%  97% 93%  Weight:      Height:        General: Pt is alert, awake, not in acute distress, resolved angioedema Cardiovascular: RRR, S1/S2 +, no rubs, no gallops Respiratory: CTA bilaterally, no wheezing, no rhonchi Abdominal: Soft, NT, ND, bowel sounds + Extremities: no edema, no cyanosis    The results of significant  diagnostics from this hospitalization (including imaging, microbiology, ancillary and laboratory) are listed below for reference.     Microbiology: No results found for this or any previous visit (from the past 240 hour(s)).   Labs: BNP (last 3 results) No results for input(s): "BNP" in the last 8760 hours. Basic Metabolic Panel: Recent Labs  Lab 05/12/22 2125 05/12/22 2140 05/13/22 0541  NA 136 137 137  K 3.3* 3.2* 3.5  CL 103 102 106  CO2 21*  --  22  GLUCOSE 238* 239* 280*  BUN 11 11 9   CREATININE 0.61 0.80 0.66  CALCIUM 8.1*  --  8.0*   Liver Function Tests: Recent Labs  Lab 05/12/22 2125  AST 40  ALT 40  ALKPHOS 54  BILITOT 0.8  PROT 6.6  ALBUMIN 3.6   No results for input(s): "LIPASE", "AMYLASE" in the last 168 hours. No results for input(s): "AMMONIA" in the last 168 hours. CBC: Recent Labs  Lab  05/12/22 2125 05/12/22 2140 05/13/22 0541  WBC 8.1  --  6.4  NEUTROABS 4.7  --   --   HGB 11.8* 12.9 11.8*  HCT 35.6* 38.0 35.1*  MCV 89.0  --  87.3  PLT 208  --  199   Cardiac Enzymes: No results for input(s): "CKTOTAL", "CKMB", "CKMBINDEX", "TROPONINI" in the last 168 hours. BNP: Invalid input(s): "POCBNP" CBG: Recent Labs  Lab 05/13/22 0058 05/13/22 0445 05/13/22 0923  GLUCAP 271* 333* 225*   D-Dimer No results for input(s): "DDIMER" in the last 72 hours. Hgb A1c No results for input(s): "HGBA1C" in the last 72 hours. Lipid Profile No results for input(s): "CHOL", "HDL", "LDLCALC", "TRIG", "CHOLHDL", "LDLDIRECT" in the last 72 hours. Thyroid function studies No results for input(s): "TSH", "T4TOTAL", "T3FREE", "THYROIDAB" in the last 72 hours.  Invalid input(s): "FREET3" Anemia work up No results for input(s): "VITAMINB12", "FOLATE", "FERRITIN", "TIBC", "IRON", "RETICCTPCT" in the last 72 hours. Urinalysis    Component Value Date/Time   COLORURINE STRAW (A) 08/13/2017 1035   APPEARANCEUR CLEAR 08/13/2017 1035   LABSPEC 1.017 08/13/2017 1035   PHURINE 6.0 08/13/2017 1035   GLUCOSEU >=500 (A) 08/13/2017 1035   HGBUR SMALL (A) 08/13/2017 1035   BILIRUBINUR NEGATIVE 08/13/2017 1035   KETONESUR NEGATIVE 08/13/2017 1035   PROTEINUR NEGATIVE 08/13/2017 1035   NITRITE NEGATIVE 08/13/2017 1035   LEUKOCYTESUR NEGATIVE 08/13/2017 1035   Sepsis Labs Recent Labs  Lab 05/12/22 2125 05/13/22 0541  WBC 8.1 6.4   Microbiology No results found for this or any previous visit (from the past 240 hour(s)).   Time coordinating discharge: Over 30 minutes  SIGNED:   Nicolette Bang, MD  [facility] 05/13/2022, 12:23 PM Pager   If 7PM-7AM, please contact night-coverage [email or web address] Password [email or web address]

## 2022-05-13 NOTE — Progress Notes (Signed)
Discharge instructions given. Family at bedside.

## 2022-05-14 ENCOUNTER — Other Ambulatory Visit: Payer: Self-pay

## 2022-05-14 ENCOUNTER — Telehealth: Payer: Self-pay

## 2022-05-14 NOTE — Telephone Encounter (Signed)
Transition Care Management Follow-up Telephone Call Date of discharge and from where: 05/13/2022, [facility] How have you been since you were released from the hospital? She said she is feeling okay.  Any questions or concerns? No  Items Reviewed: Did the pt receive and understand the discharge instructions provided? Yes  Medications obtained and verified?  She said she has all of her medications except the prednisone and amlodipine.  She plans to  pick them up today.  She also has a glucometer and has her lantus Other? No  Any new allergies since your discharge?  lisinopril Dietary orders reviewed? No Do you have support at home? Yes   Home Care and Equipment/Supplies: Were home health services ordered? no If so, what is the name of the agency? N/a  Has the agency set up a time to come to the patient's home? not applicable Were any new equipment or medical supplies ordered?  No What is the name of the medical supply agency? N/a Were you able to get the supplies/equipment? not applicable Do you have any questions related to the use of the equipment or supplies? No  Functional Questionnaire: (I = Independent and D = Dependent) ADLs: independent   Follow up appointments reviewed:  PCP Hospital f/u appt confirmed? Yes  Scheduled to see Bertram Denver, NP - 05/28/2022  Specialist Hospital f/u appt confirmed?  None scheduled at this time    Are transportation arrangements needed? No  If their condition worsens, is the pt aware to call PCP or go to the Emergency Dept.? Yes Was the patient provided with contact information for the PCP's office or ED? Yes Was to pt encouraged to call back with questions or concerns? Yes

## 2022-05-15 ENCOUNTER — Other Ambulatory Visit: Payer: Self-pay

## 2022-05-28 ENCOUNTER — Ambulatory Visit: Payer: Self-pay | Admitting: Nurse Practitioner

## 2022-06-08 ENCOUNTER — Ambulatory Visit: Payer: Self-pay | Admitting: Pharmacist

## 2022-06-20 ENCOUNTER — Other Ambulatory Visit: Payer: Self-pay

## 2022-06-20 NOTE — Progress Notes (Signed)
S:     PCP: Huel Coventry Vincenza Dail is a 51 y.o. female who presents for hypertension evaluation, education, and management. PMH is significant for HTN, T2DM, HLD, and EtOH abuse.   Patient was referred by pharmacist Valeda Malm on 9/13. Patient is moving out of the state at the end of the month and would like to discuss her BP regimen since discontinuation of lisinopril.   Patient was admitted for angioedema in August of 2023 after restarting lisinopril 40 mg in June of 2023. She was previously on lisinopril but had stopped all of her HTN and DM medications following the death of her mother in January 09, 2023. She was medically treated with steroids and did not require intubation.  Today, patient arrives in good spirits and presents without assistance. Denies dizziness, headache, blurred vision, swelling.  Family/Social history:  -Fhx: DM, HTN -Tobacco: denies -Alcohol:endorses   Medication adherence missed 2-3 doses in the last month . Patient has taken BP medications today.   Current antihypertensives include: amlodipine 10mg  once daily  Antihypertensives tried in the past include: lisinopril (angioedema)  Reported home BP readings: no cuff at home  Patient reported dietary habits: sometimes eats some fried food but tries to lay off salt  Patient-reported exercise habits: walking most days   O:   Vitals:   06/21/22 1119  BP: (!) 150/87  Pulse: (!) 108    Last 3 Office BP readings: BP Readings from Last 3 Encounters:  05/13/22 (!) 180/88  03/23/22 (!) 155/96  02/21/22 (!) 172/97    BMET    Component Value Date/Time   NA 137 05/13/2022 0541   NA 135 08/09/2021 1043   K 3.5 05/13/2022 0541   CL 106 05/13/2022 0541   CO2 22 05/13/2022 0541   GLUCOSE 280 (H) 05/13/2022 0541   BUN 9 05/13/2022 0541   BUN 11 08/09/2021 1043   CREATININE 0.66 05/13/2022 0541   CREATININE 0.78 10/18/2014 1516   CALCIUM 8.0 (L) 05/13/2022 0541   GFRNONAA >60 05/13/2022 0541    GFRNONAA >89 10/18/2014 1516   GFRAA 112 11/21/2020 0928   GFRAA >89 10/18/2014 1516    Renal function: CrCl cannot be calculated (Patient's most recent lab result is older than the maximum 21 days allowed.).  Clinical ASCVD: No  The 10-year ASCVD risk score (Arnett DK, et al., 2019) is: 9.3%   Values used to calculate the score:     Age: 58 years     Sex: Female     Is Non-Hispanic African American: Yes     Diabetic: Yes     Tobacco smoker: No     Systolic Blood Pressure: 180 mmHg     Is BP treated: No     HDL Cholesterol: 82 mg/dL     Total Cholesterol: 167 mg/dL   A/P: Hypertension diagnosed currently and historically uncontrolled on current medications.  BP goal < 130/80 mmHg. Medication adherence appears optimal. Control is suboptimal due to discontinuation of ACEi therapy and dietary indiscretions. Hesitancy with initiated of any RAAS therapy with her history of angioedema.  -Continued amlodipine 10 mg once daily. -Initiated chlorthalidone 25 mg once daily  -Patient educated on purpose, proper use, and potential adverse effects of chlorthalidone.  -Emphasized the importance of follow up in one month with a PCP when she moves out of state for HTN management and labs given her history of hypokalemia. -Counseled on lifestyle modifications for blood pressure control including reduced dietary sodium, increased exercise, adequate sleep.  Written patient instructions provided. Patient verbalized understanding of treatment plan.  Total time in face to face counseling 30 minutes.    Follow-up:  With new PCP in a month for HTN management.   Irish Elders, PharmD PGY-1 [facility] Resident

## 2022-06-20 NOTE — Progress Notes (Signed)
Patient appearing on report for [employer] [facility] - Hypertension Control report due to last documented ambulatory blood pressure of 155/96 mmHg on 03/23/2022. Next appointment with PCP is 07/23/2022. However, patient plans to move out of state by the end of the month.    Outreached patient to discuss hypertension control and medication management.   Current antihypertensives: amlodipine 10 mg daily (last filled 05/15/2022)  Of note, patient restarted lisinopril 40 mg daily in June 2023 after not taking for several months. In August 2023, patient was admitted for ACEi-induced angioedema. Did not require intubation. Medically treated with steroids.   Patient denies hypotensive signs and symptoms including dizziness, lightheadedness.  Patient denies hypertensive symptoms including headache, chest pain, shortness of breath.  Patient denies side effects related to amlodipine   Assessment/Plan: - Currently uncontrolled based on last OV BP - Reviewed goal blood pressure <130/80 - Reviewed appropriate administration of medication regimen - Patient would like to discuss BP regimen and assess if she needs additional therapy (now that she is off of lisinopril d/t angioedema) before moving out of state at the end of the month - Scheduled appointment with CPP for BP check on 06/21/2022. Patient agreeable.   Valeda Malm, Pharm.D. PGY-2 [facility] Resident 06/20/2022 11:44 AM

## 2022-06-21 ENCOUNTER — Other Ambulatory Visit: Payer: Self-pay

## 2022-06-21 ENCOUNTER — Ambulatory Visit: Payer: Self-pay | Attending: Nurse Practitioner | Admitting: Pharmacist

## 2022-06-21 VITALS — BP 150/87 | HR 108

## 2022-06-21 DIAGNOSIS — I10 Essential (primary) hypertension: Secondary | ICD-10-CM

## 2022-06-21 MED ORDER — CHLORTHALIDONE 25 MG PO TABS
25.0000 mg | ORAL_TABLET | Freq: Every day | ORAL | 2 refills | Status: DC
  Filled 2022-06-21: qty 30, 30d supply, fill #0

## 2022-07-23 ENCOUNTER — Ambulatory Visit: Payer: Self-pay | Attending: Nurse Practitioner | Admitting: Nurse Practitioner

## 2022-07-23 ENCOUNTER — Encounter: Payer: Self-pay | Admitting: Nurse Practitioner

## 2022-07-23 ENCOUNTER — Other Ambulatory Visit: Payer: Self-pay

## 2022-07-23 VITALS — BP 141/85 | HR 99 | Ht 67.0 in | Wt 187.0 lb

## 2022-07-23 DIAGNOSIS — I10 Essential (primary) hypertension: Secondary | ICD-10-CM

## 2022-07-23 DIAGNOSIS — E1142 Type 2 diabetes mellitus with diabetic polyneuropathy: Secondary | ICD-10-CM

## 2022-07-23 DIAGNOSIS — E1169 Type 2 diabetes mellitus with other specified complication: Secondary | ICD-10-CM

## 2022-07-23 DIAGNOSIS — Z1231 Encounter for screening mammogram for malignant neoplasm of breast: Secondary | ICD-10-CM

## 2022-07-23 DIAGNOSIS — D649 Anemia, unspecified: Secondary | ICD-10-CM

## 2022-07-23 DIAGNOSIS — Z794 Long term (current) use of insulin: Secondary | ICD-10-CM

## 2022-07-23 DIAGNOSIS — E785 Hyperlipidemia, unspecified: Secondary | ICD-10-CM

## 2022-07-23 LAB — GLUCOSE, POCT (MANUAL RESULT ENTRY): POC Glucose: 386 mg/dl — AB (ref 70–99)

## 2022-07-23 LAB — POCT GLYCOSYLATED HEMOGLOBIN (HGB A1C): HbA1c, POC (controlled diabetic range): 13.5 % — AB (ref 0.0–7.0)

## 2022-07-23 MED ORDER — GABAPENTIN 300 MG PO CAPS
300.0000 mg | ORAL_CAPSULE | Freq: Three times a day (TID) | ORAL | 3 refills | Status: AC
  Filled 2022-07-23: qty 90, 30d supply, fill #0

## 2022-07-23 MED ORDER — METFORMIN HCL 500 MG PO TABS
500.0000 mg | ORAL_TABLET | Freq: Two times a day (BID) | ORAL | 1 refills | Status: AC
  Filled 2022-07-23: qty 180, 90d supply, fill #0

## 2022-07-23 MED ORDER — BASAGLAR KWIKPEN 100 UNIT/ML ~~LOC~~ SOPN
25.0000 [IU] | PEN_INJECTOR | Freq: Every day | SUBCUTANEOUS | 2 refills | Status: AC
  Filled 2022-07-23: qty 6, 24d supply, fill #0
  Filled 2022-10-15: qty 6, 24d supply, fill #1

## 2022-07-23 MED ORDER — GLIMEPIRIDE 4 MG PO TABS
8.0000 mg | ORAL_TABLET | Freq: Every day | ORAL | 1 refills | Status: AC
  Filled 2022-07-23: qty 60, 30d supply, fill #0

## 2022-07-23 MED ORDER — AMLODIPINE BESYLATE 10 MG PO TABS
10.0000 mg | ORAL_TABLET | Freq: Every day | ORAL | 1 refills | Status: AC
  Filled 2022-07-23: qty 30, 30d supply, fill #0
  Filled 2022-09-05: qty 30, 30d supply, fill #1
  Filled 2022-10-14: qty 30, 30d supply, fill #2

## 2022-07-23 MED ORDER — FENOFIBRATE 48 MG PO TABS
ORAL_TABLET | Freq: Every day | ORAL | 2 refills | Status: AC
  Filled 2022-07-23: qty 30, 30d supply, fill #0

## 2022-07-23 MED ORDER — ATORVASTATIN CALCIUM 40 MG PO TABS
40.0000 mg | ORAL_TABLET | Freq: Every day | ORAL | 2 refills | Status: AC
  Filled 2022-07-23: qty 30, 30d supply, fill #0

## 2022-07-23 MED ORDER — CHLORTHALIDONE 25 MG PO TABS
25.0000 mg | ORAL_TABLET | Freq: Every day | ORAL | 1 refills | Status: AC
  Filled 2022-07-23: qty 30, 30d supply, fill #0
  Filled 2022-09-05: qty 30, 30d supply, fill #1
  Filled 2022-10-14: qty 30, 30d supply, fill #2

## 2022-07-23 MED ORDER — AMLODIPINE BESYLATE 10 MG PO TABS
10.0000 mg | ORAL_TABLET | Freq: Every day | ORAL | 1 refills | Status: DC
  Filled 2022-07-23: qty 90, 90d supply, fill #0

## 2022-07-23 MED ORDER — OZEMPIC (0.25 OR 0.5 MG/DOSE) 2 MG/3ML ~~LOC~~ SOPN
0.2500 mg | PEN_INJECTOR | SUBCUTANEOUS | 2 refills | Status: AC
  Filled 2022-07-23: qty 3, 42d supply, fill #0

## 2022-07-23 NOTE — Progress Notes (Signed)
Assessment & Plan:  Phyllis Kemp was seen today for diabetes and hypertension.  Diagnoses and all orders for this visit:  Type 2 diabetes mellitus with diabetic polyneuropathy, with long-term current use of insulin (HCC) -     POCT glycosylated hemoglobin (Hb A1C) -     POCT glucose (manual entry) -     Microalbumin / creatinine urine ratio -     CMP14+EGFR  Essential hypertension -     amLODipine (NORVASC) 10 MG tablet; Take 1 tablet (10 mg total) by mouth daily. (Patient not taking: Reported on 07/23/2022)  Breast cancer screening by mammogram -     MM DIGITAL SCREENING BILATERAL; Future  Anemia, unspecified type -     CBC with Differential  Hyperlipidemia associated with type 2 diabetes mellitus ([facility]) -     Lipid panel    Patient has been counseled on age-appropriate routine health concerns for screening and prevention. These are reviewed and up-to-date. Referrals have been placed accordingly. Immunizations are up-to-date or declined.    Subjective:   Chief Complaint  Patient presents with   Diabetes   Hypertension   HPI Phyllis Kemp 51 y.o. female presents to office today for follow up to HTN/HPL and DM  She will be moving out of [place] next month and will likely no longer continue to be a [facility]   Hypertension She is not exercising and is not adherent to low salt diet.  She does not have a blood pressure log  today.  Blood pressure is not well controlled at home.  Cardiac symptoms none. Cardiovascular risk factors: diabetes mellitus, dyslipidemia, and hypertension. Use of agents associated with hypertension: none.  History of target organ damage: none. BP Readings from Last 3 Encounters:  07/23/22 (!) 141/85  06/21/22 (!) 150/87  05/13/22 (!) 180/88    Hyperlipidemia Patient presents for follow up to hyperlipidemia.  She is not medication compliant. She is not diet compliant and denies statin intolerance including  myalgias.  She is not adherent with taking atorvastatin or fenofibrate. Lab Results  Component Value Date   CHOL 167 11/21/2020   Lab Results  Component Value Date   HDL 82 11/21/2020   Lab Results  Component Value Date   LDLCALC 50 11/21/2020   Lab Results  Component Value Date   TRIG 225 (H) 11/21/2020   Lab Results  Component Value Date   CHOLHDL 2.0 11/21/2020      Diabetes Mellitus Type II Current symptoms/problems include hyperglycemia and paresthesia of the feet and have been unchanged.  Known diabetic complications: peripheral neuropathy Current diabetic medications include: Basaglar, glimepiride, metformin, Trulicity.  She stopped taking Trulicity several months ago due to GI upset however I was not aware of this.  She states metformin 1000 mg twice daily causes loose stools so we will decrease to 500 mg twice daily.  She will continue on 8 mg of glimepiride we will switch Lantus to Basaglar 25 units at bedtime and start her on Ozempic 0.25 mg weekly. Eye exam current (within one year): no uninsured Weight trend: decreasing steadily Prior visit with dietician: no uninsured Current monitoring regimen: office lab tests - quarterly however she has missed a few appointments. Home blood sugar records: trend: None adherent with checking blood glucose levels at home Any episodes of hypoglycemia? no Is She on ACE inhibitor or angiotensin II receptor blocker?  No due to ACE intolerance Lab Results  Component Value Date   HGBA1C 13.5 (  A) 07/23/2022   HGBA1C 12.2 (A) 03/23/2022   HGBA1C 13.5 (A) 08/09/2021         Review of Systems  Constitutional:  Negative for fever, malaise/fatigue and weight loss.  HENT: Negative.  Negative for nosebleeds.   Eyes: Negative.  Negative for blurred vision, double vision and photophobia.  Respiratory: Negative.  Negative for cough and shortness of breath.   Cardiovascular: Negative.  Negative for chest pain, palpitations and leg  swelling.  Gastrointestinal: Negative.  Negative for heartburn, nausea and vomiting.  Musculoskeletal: Negative.  Negative for myalgias.  Neurological: Negative.  Negative for dizziness, focal weakness, seizures and headaches.  Psychiatric/Behavioral: Negative.  Negative for suicidal ideas.     Past Medical History:  Diagnosis Date   Asthma    Diabetes mellitus type 2 with complications, uncontrolled 2003   Hyperlipidemia    Hypertension 2003    Past Surgical History:  Procedure Laterality Date   ABDOMINAL HYSTERECTOMY  2006   partial for fibroid tumors, still has cervix and ovaries    APPENDECTOMY  1985   COLPOSCOPY W/ BIOPSY / CURETTAGE  10/27/2019        Family History  Problem Relation Age of Onset   Diabetes Mother    Hypertension Mother    Cancer Maternal Aunt        breast    Depression Maternal Grandmother     Social History Reviewed with no changes to be made today.   Outpatient Medications Prior to Visit  Medication Sig Dispense Refill   Blood Glucose Monitoring Suppl (TRUE METRIX METER) w/Device KIT Use as instructed 1 kit 0   chlorthalidone (HYGROTON) 25 MG tablet Take 1 tablet (25 mg total) by mouth daily. 30 tablet 2   fenofibrate (TRICOR) 48 MG tablet TAKE 1 TABLET (48 MG TOTAL) BY MOUTH ONCE DAILY. 90 tablet 2   gabapentin (NEURONTIN) 300 MG capsule Take 1 capsule (300 mg total) by mouth 3 (three) times daily. 90 capsule 3   glimepiride (AMARYL) 4 MG tablet TAKE 2 TABLETS (8 MG TOTAL) BY MOUTH DAILY BEFORE BREAKFAST. 180 tablet 1   Insulin Glargine (BASAGLAR KWIKPEN) 100 UNIT/ML Inject 15 Units into the skin once nightly at bedtime. 6 mL 2   insulin glargine (LANTUS) 100 UNIT/ML injection Inject 15 Units into the skin daily.     Insulin Pen Needle (TRUEPLUS 5-BEVEL PEN NEEDLES) 31G X 5 MM MISC Use as instructed to inject into the skin once nightly. 100 each 6   metFORMIN (GLUCOPHAGE) 500 MG tablet TAKE 2 TABLETS (1,000 MG TOTAL) BY MOUTH 2 (TWO) TIMES DAILY  WITH A MEAL. 360 tablet 1   Multiple Vitamins-Minerals (ONE-A-DAY WOMENS PO) Take 2 tablets by mouth daily. Chewable     naproxen sodium (ALEVE) 220 MG tablet Take 220 mg by mouth daily as needed (pain).     atorvastatin (LIPITOR) 40 MG tablet TAKE 1 TABLET (40 MG TOTAL) BY MOUTH ONCE DAILY. (Patient not taking: Reported on 07/23/2022) 90 tablet 2   Dulaglutide (TRULICITY) 9.45 WT/8.8EK SOPN Inject 0.75 mg into the skin once a week. (Patient not taking: Reported on 07/23/2022) 2 mL 3   acetaminophen (TYLENOL) 500 MG tablet Take 1 tablet (500 mg total) by mouth every 6 (six) hours as needed. (Patient not taking: Reported on 05/13/2022) 30 tablet 0   amLODipine (NORVASC) 10 MG tablet Take 1 tablet (10 mg total) by mouth once daily. (Patient not taking: Reported on 07/23/2022) 30 tablet 0   lidocaine (XYLOCAINE) 2 % solution  Use as directed 15 mLs in the mouth or throat as needed for mouth pain. (Patient not taking: Reported on 05/13/2022) 100 mL 0   predniSONE (STERAPRED UNI-PAK 21 TAB) 10 MG (21) TBPK tablet Take per package insert (Patient not taking: Reported on 07/23/2022) 21 each 0   No facility-administered medications prior to visit.    Allergies  Allergen Reactions   Lisinopril Swelling    ACEi-induced angioedema       Objective:    BP (!) 141/85   Pulse 99   Ht 5' 7"  (1.702 m)   Wt 187 lb (84.8 kg)   SpO2 97%   BMI 29.29 kg/m  Wt Readings from Last 3 Encounters:  07/23/22 187 lb (84.8 kg)  05/13/22 203 lb 4.2 oz (92.2 kg)  03/23/22 189 lb 12.8 oz (86.1 kg)    Physical Exam Vitals and nursing note reviewed.  Constitutional:      Appearance: She is well-developed.  HENT:     Head: Normocephalic and atraumatic.  Cardiovascular:     Rate and Rhythm: Normal rate and regular rhythm.     Heart sounds: Normal heart sounds. No murmur heard.    No friction rub. No gallop.  Pulmonary:     Effort: Pulmonary effort is normal. No tachypnea or respiratory distress.     Breath  sounds: Normal breath sounds. No decreased breath sounds, wheezing, rhonchi or rales.  Chest:     Chest wall: No tenderness.  Abdominal:     General: Bowel sounds are normal.     Palpations: Abdomen is soft.  Musculoskeletal:        General: Normal range of motion.     Cervical back: Normal range of motion.  Skin:    General: Skin is warm and dry.  Neurological:     Mental Status: She is alert and oriented to person, place, and time.     Coordination: Coordination normal.  Psychiatric:        Behavior: Behavior normal. Behavior is cooperative.        Thought Content: Thought content normal.        Judgment: Judgment normal.          Patient has been counseled extensively about nutrition and exercise as well as the importance of adherence with medications and regular follow-up. The patient was given clear instructions to go to ER or return to medical center if symptoms don't improve, worsen or new problems develop. The patient verbalized understanding.   Follow-up: Return in about 3 months (around 10/23/2022) for HTN/HPL/DM.   Gildardo Pounds, FNP-[facility] [place], [place]   07/23/2022, 9:52 AM

## 2022-07-24 LAB — CMP14+EGFR
ALT: 94 IU/L — ABNORMAL HIGH (ref 0–32)
AST: 89 IU/L — ABNORMAL HIGH (ref 0–40)
Albumin/Globulin Ratio: 1.5 (ref 1.2–2.2)
Albumin: 4.7 g/dL (ref 3.8–4.9)
Alkaline Phosphatase: 75 IU/L (ref 44–121)
BUN/Creatinine Ratio: 27 — ABNORMAL HIGH (ref 9–23)
BUN: 30 mg/dL — ABNORMAL HIGH (ref 6–24)
Bilirubin Total: 0.8 mg/dL (ref 0.0–1.2)
CO2: 25 mmol/L (ref 20–29)
Calcium: 10 mg/dL (ref 8.7–10.2)
Chloride: 91 mmol/L — ABNORMAL LOW (ref 96–106)
Creatinine, Ser: 1.11 mg/dL — ABNORMAL HIGH (ref 0.57–1.00)
Globulin, Total: 3.1 g/dL (ref 1.5–4.5)
Glucose: 398 mg/dL — ABNORMAL HIGH (ref 70–99)
Potassium: 5 mmol/L (ref 3.5–5.2)
Sodium: 133 mmol/L — ABNORMAL LOW (ref 134–144)
Total Protein: 7.8 g/dL (ref 6.0–8.5)
eGFR: 60 mL/min/{1.73_m2} (ref >59)

## 2022-07-24 LAB — LIPID PANEL
Chol/HDL Ratio: 3.4 ratio (ref 0.0–4.4)
Cholesterol, Total: 283 mg/dL — ABNORMAL HIGH (ref 100–199)
HDL: 83 mg/dL (ref >39)
LDL Chol Calc (NIH): 160 mg/dL — ABNORMAL HIGH (ref 0–99)
Triglycerides: 222 mg/dL — ABNORMAL HIGH (ref 0–149)
VLDL Cholesterol Cal: 40 mg/dL (ref 5–40)

## 2022-07-24 LAB — CBC WITH DIFFERENTIAL/PLATELET
Basophils Absolute: 0 10*3/uL (ref 0.0–0.2)
Basos: 1 %
EOS (ABSOLUTE): 0.1 10*3/uL (ref 0.0–0.4)
Eos: 1 %
Hematocrit: 43.6 % (ref 34.0–46.6)
Hemoglobin: 14 g/dL (ref 11.1–15.9)
Immature Grans (Abs): 0 10*3/uL (ref 0.0–0.1)
Immature Granulocytes: 0 %
Lymphocytes Absolute: 1.4 10*3/uL (ref 0.7–3.1)
Lymphs: 28 %
MCH: 28.5 pg (ref 26.6–33.0)
MCHC: 32.1 g/dL (ref 31.5–35.7)
MCV: 89 fL (ref 79–97)
Monocytes Absolute: 0.3 10*3/uL (ref 0.1–0.9)
Monocytes: 7 %
Neutrophils Absolute: 3.1 10*3/uL (ref 1.4–7.0)
Neutrophils: 63 %
Platelets: 184 10*3/uL (ref 150–450)
RBC: 4.91 x10E6/uL (ref 3.77–5.28)
RDW: 11.8 % (ref 11.7–15.4)
WBC: 4.9 10*3/uL (ref 3.4–10.8)

## 2022-07-24 LAB — MICROALBUMIN / CREATININE URINE RATIO
Creatinine, Urine: 170.5 mg/dL
Microalb/Creat Ratio: 223 mg/g creat — ABNORMAL HIGH (ref 0–29)
Microalbumin, Urine: 380.4 ug/mL

## 2022-07-26 ENCOUNTER — Other Ambulatory Visit: Payer: Self-pay

## 2022-09-05 ENCOUNTER — Other Ambulatory Visit: Payer: Self-pay

## 2022-10-23 ENCOUNTER — Ambulatory Visit: Payer: Self-pay | Admitting: Nurse Practitioner

## 2022-10-29 ENCOUNTER — Ambulatory Visit: Payer: Self-pay | Admitting: Nurse Practitioner

## 2023-06-24 ENCOUNTER — Other Ambulatory Visit: Payer: Self-pay

## 2023-08-15 IMAGING — DX DG CHEST 1V PORT
1 series · 1 of 1 positions shown · non-contrast
Comparison: Chest radiograph 03/05/2019

CLINICAL DATA: Chest tightness, shortness of breath

EXAM:
PORTABLE CHEST 1 VIEW

[chest]
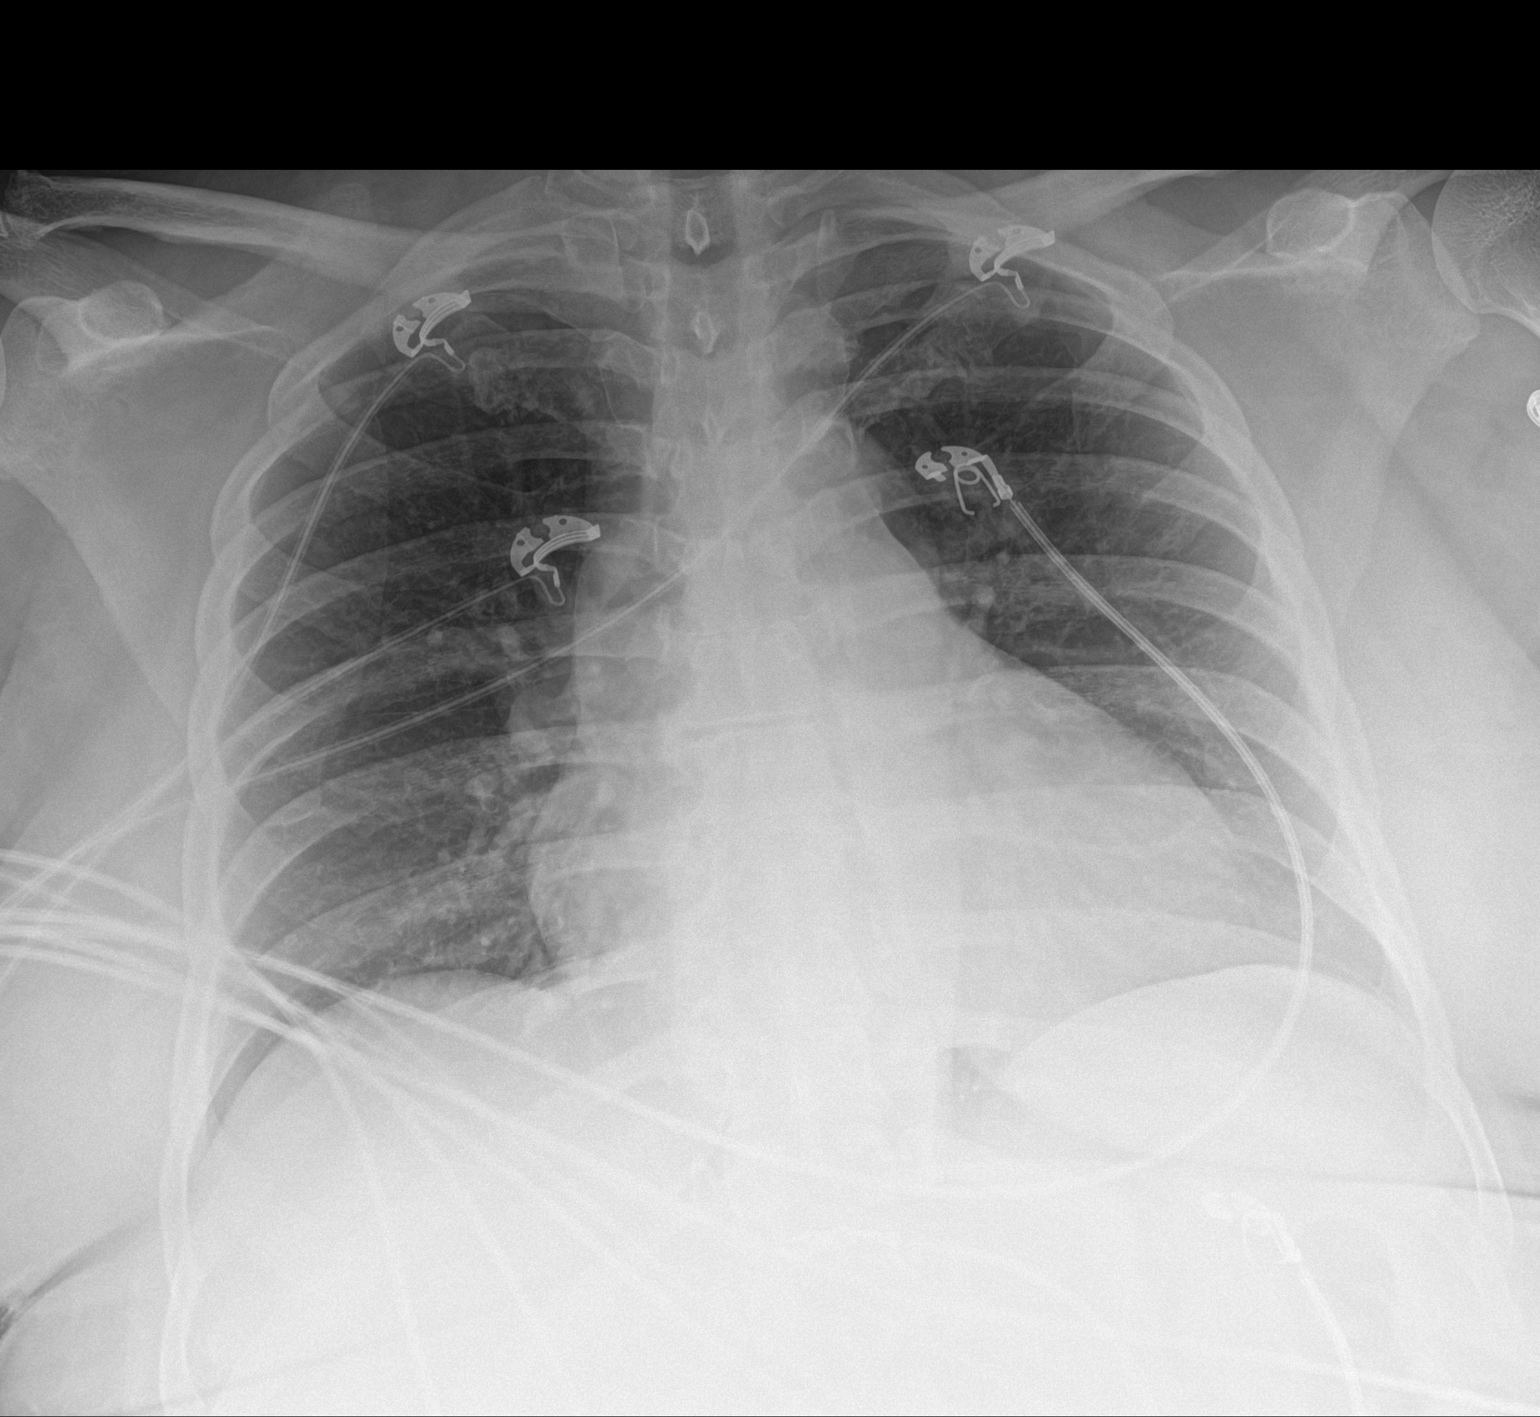

[1 of 1 positions shown; findings below may reference images not displayed]

FINDINGS: The heart is enlarged. The mediastinal contours are within normal
limits.

There is no focal consolidation or pulmonary edema. There is no
pleural effusion or pneumothorax

There is no acute osseous abnormality.
IMPRESSION: Cardiomegaly. No radiographic evidence of acute cardiopulmonary
process.

## 2023-10-04 IMAGING — DX DG CHEST 2V
2 series · 2 of 2 positions shown · non-contrast
Comparison: 11/27/2021

CLINICAL DATA: Productive cough.  Generalized body aches.

EXAM:
CHEST - 2 VIEW

[chest pa]
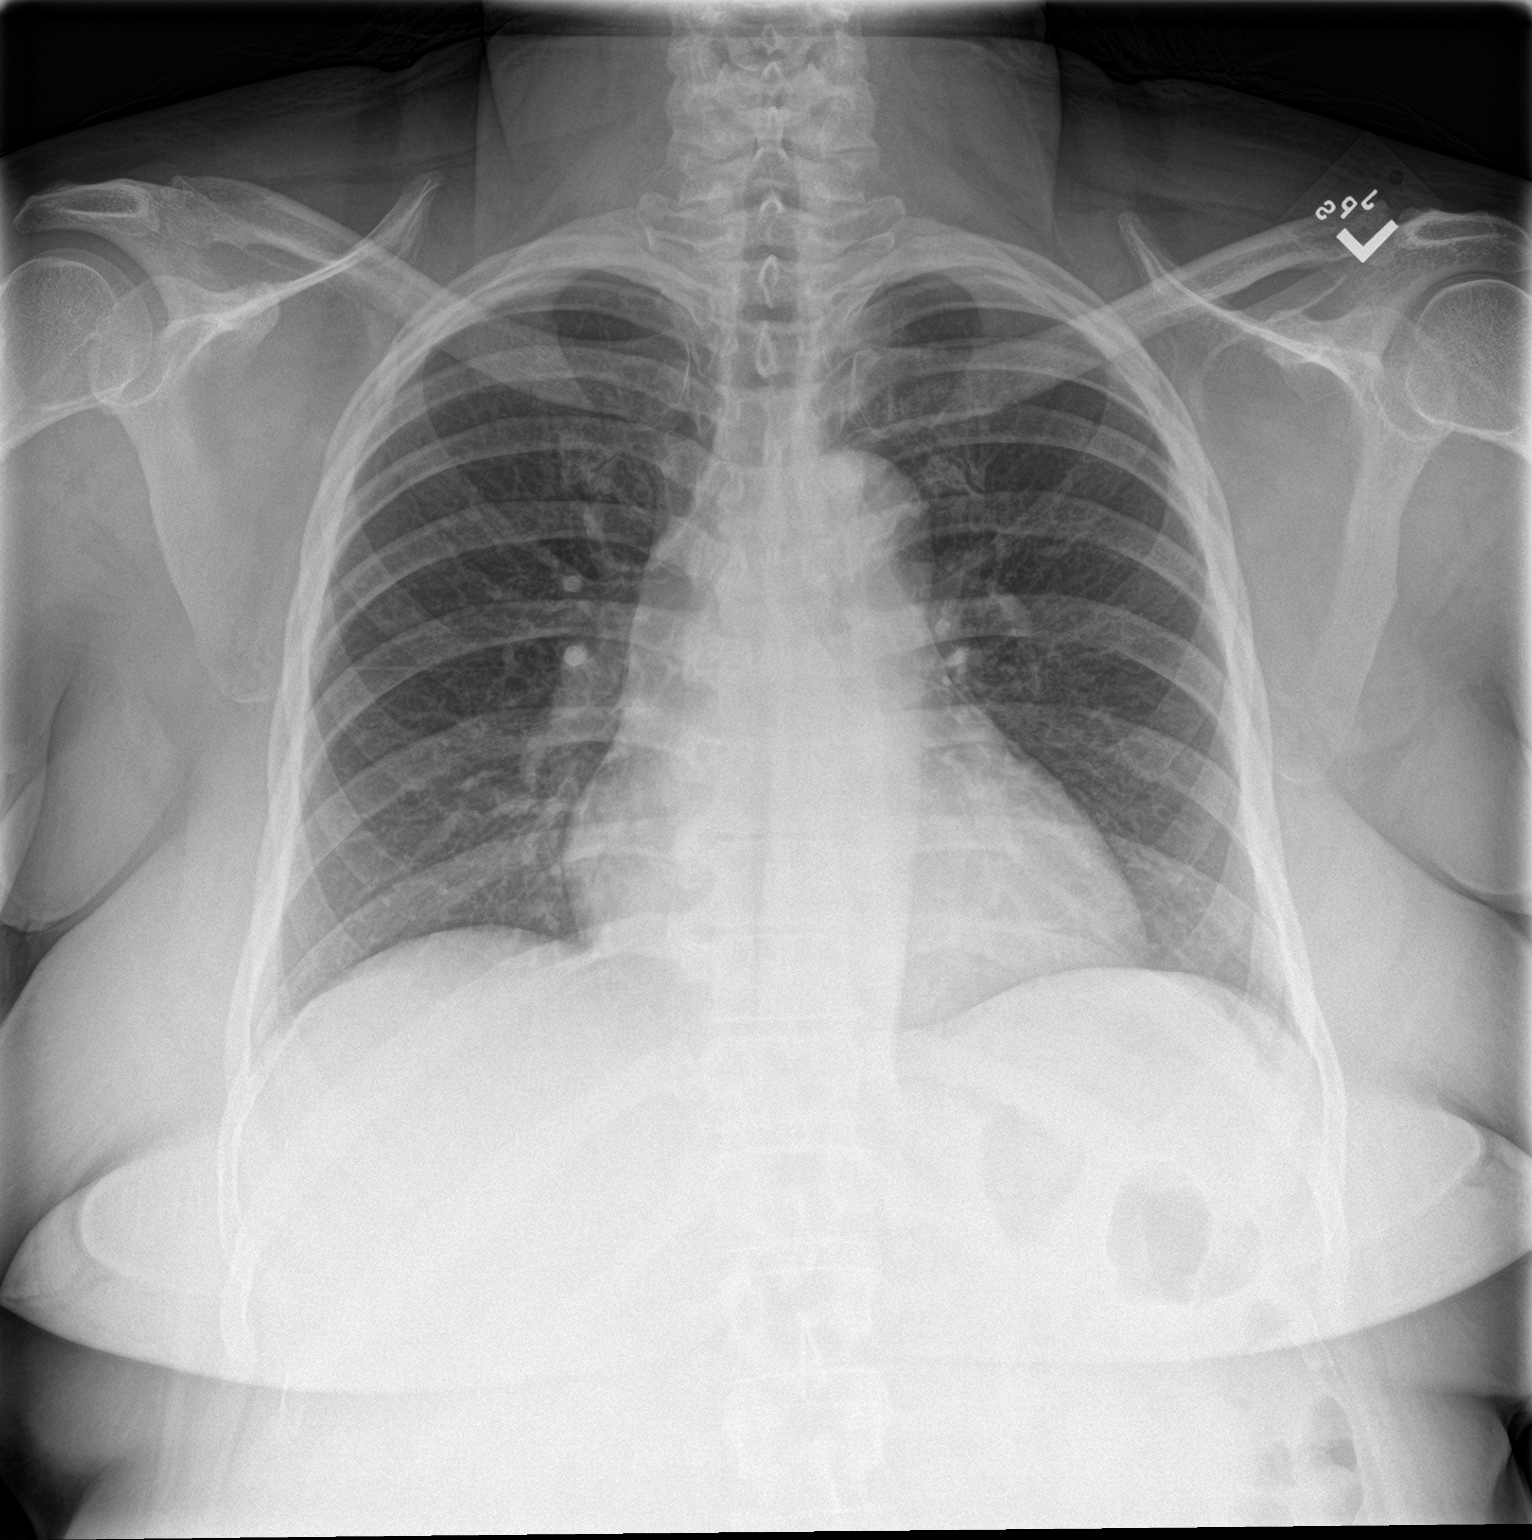

[chest lat]
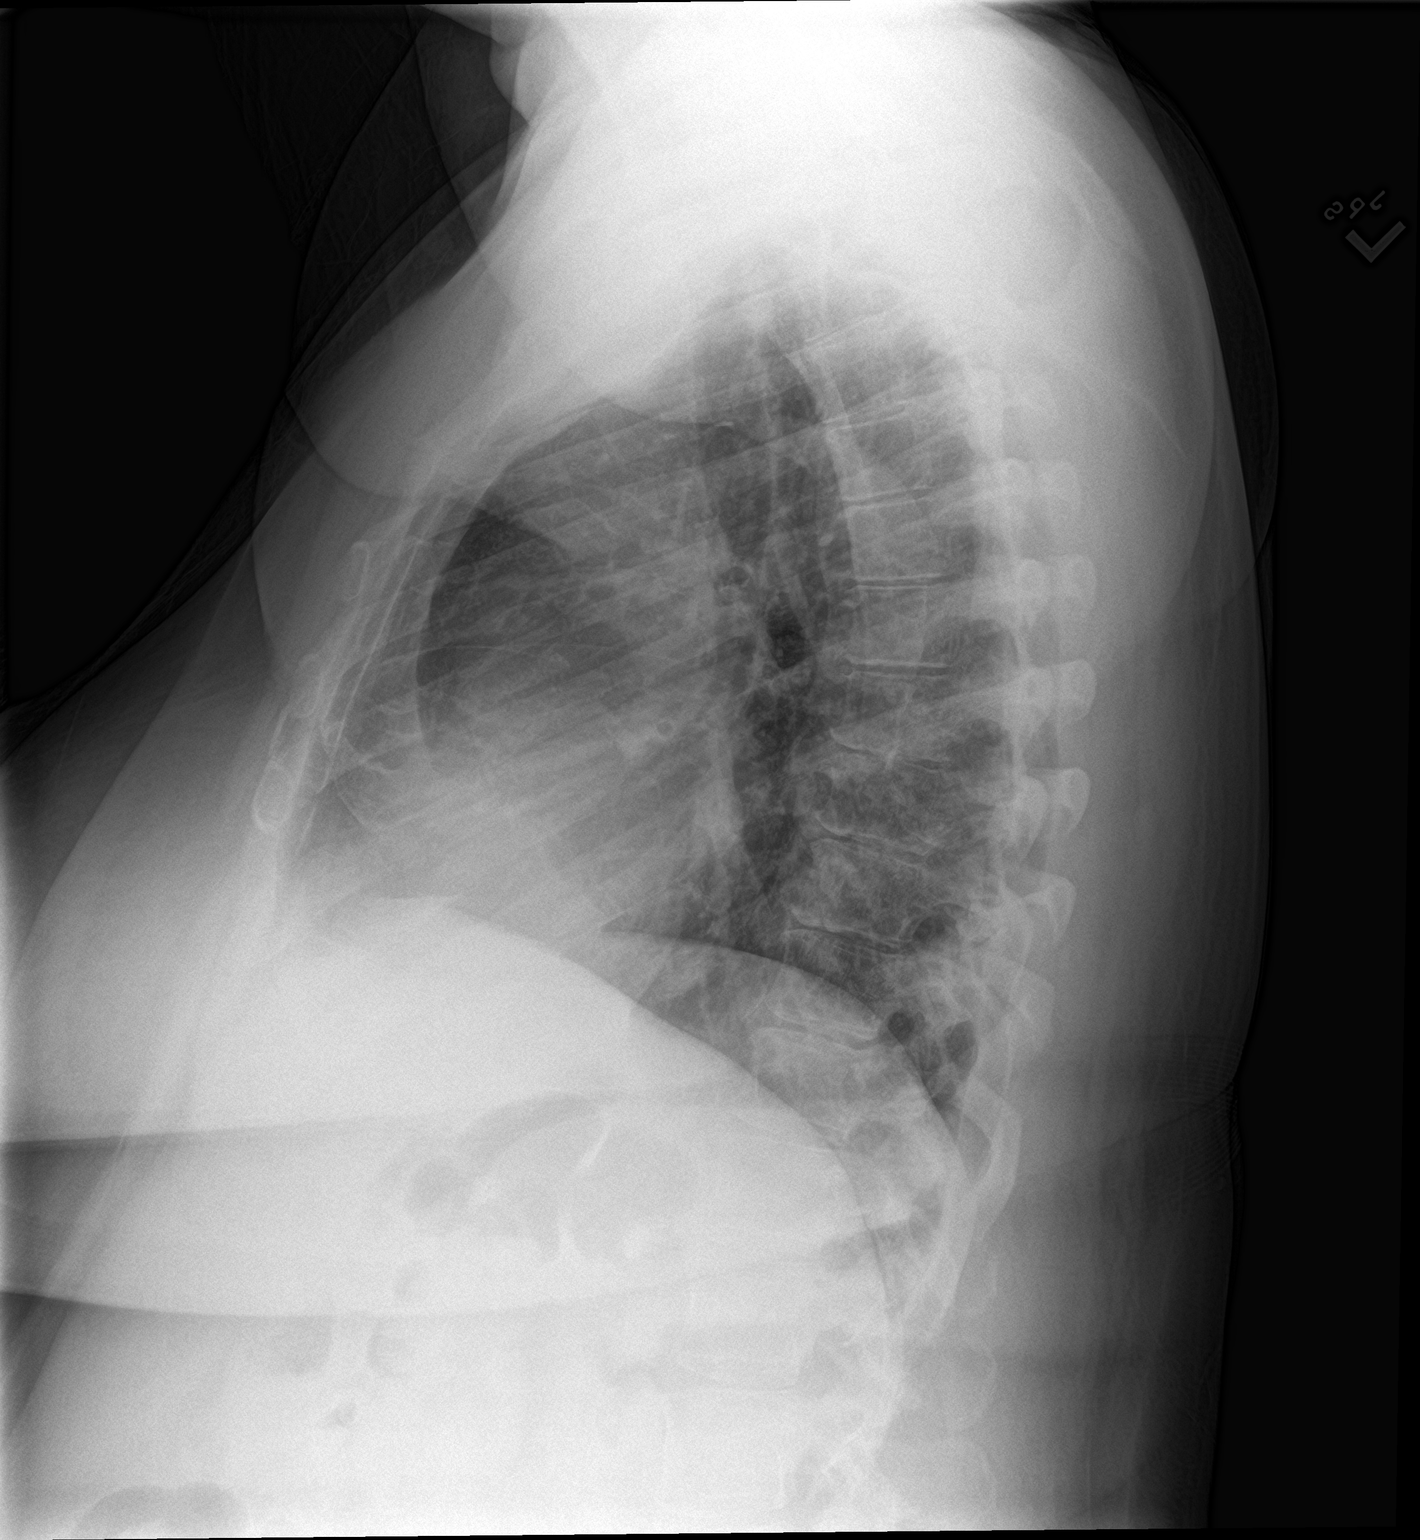

[2 of 2 positions shown; findings below may reference images not displayed]

FINDINGS: Heart and mediastinal shadows are normal. Left lung is clear. There
is medial right lower lobe pneumonia. No effusion. No acute finding.
IMPRESSION: Medial right lower lobe pneumonia.

## 2024-01-31 ENCOUNTER — Other Ambulatory Visit (HOSPITAL_COMMUNITY): Payer: Self-pay
